# Patient Record
Sex: Female | Born: 1990 | Race: Black or African American | Hispanic: No | Marital: Single | Smoking: Never smoker
Health system: Southern US, Community
[De-identification: ages and names within clinical notes are randomized; demographics above are authoritative.]

## PROBLEM LIST (undated history)

## (undated) DIAGNOSIS — N390 Urinary tract infection, site not specified: Secondary | ICD-10-CM

## (undated) DIAGNOSIS — Z789 Other specified health status: Secondary | ICD-10-CM

## (undated) HISTORY — PX: NO PAST SURGERIES: SHX2092

## (undated) HISTORY — DX: Urinary tract infection, site not specified: N39.0

---

## 2013-10-26 ENCOUNTER — Emergency Department (HOSPITAL_COMMUNITY)
Admission: EM | Admit: 2013-10-26 | Discharge: 2013-10-26 | Disposition: A | Discharge status: Home / Self Care | Payer: Self-pay

## 2019-05-17 ENCOUNTER — Emergency Department (HOSPITAL_COMMUNITY)
Admission: EM | Admit: 2019-05-17 | Discharge: 2019-05-17 | Disposition: A | Discharge status: Home / Self Care | Payer: Self-pay | Attending: Emergency Medicine | Admitting: Emergency Medicine

## 2019-05-17 ENCOUNTER — Other Ambulatory Visit: Payer: Self-pay

## 2019-05-17 DIAGNOSIS — J309 Allergic rhinitis, unspecified: Secondary | ICD-10-CM | POA: Insufficient documentation

## 2019-05-17 DIAGNOSIS — Z79899 Other long term (current) drug therapy: Secondary | ICD-10-CM | POA: Insufficient documentation

## 2019-05-17 MED ORDER — KETOTIFEN FUMARATE 0.025 % OP SOLN: 1.0000 [drp] | Freq: Two times a day (BID) | OPHTHALMIC | 0 refills | Status: DC

## 2019-05-17 MED ORDER — LEVOCETIRIZINE DIHYDROCHLORIDE 5 MG PO TABS: 5.0000 mg | ORAL_TABLET | Freq: Every evening | ORAL | 0 refills | Status: DC

## 2019-05-17 MED ORDER — FLUTICASONE PROPIONATE 50 MCG/ACT NA SUSP: 1.0000 | Freq: Every day | NASAL | 2 refills | Status: AC

## 2019-05-17 NOTE — ED Triage Notes (Signed)
Pt reports she has had congestion for about 2-3 months. Reports she has tried several cold medications over the past few months with no relief. Pt denies any fevers. Does report a sore throat and earache on the right side.

## 2019-05-17 NOTE — ED Provider Notes (Signed)
[facility] EMERGENCY DEPARTMENT Provider Note   CSN: [id number] Arrival date & time: 05/17/19  1950    History   Chief Complaint Chief Complaint  Patient presents with  . Nasal Congestion    HPI Beverly Dickson is a 28 y.o. female. Who presents emergency department with chief complaint of itchy/runny nose.  Patient has had 3 months of itching in her throat and ears, daily clear nasal discharge, itchy and watery eyes.  She has tried Human resources officer, Mucinex allergy, Benadryl, Tylenol sinus and congestion without relief of her symptoms.  She has no new animals in her house.  She does not have a history of specific seasonal allergies.  She has not followed up with a primary care physician.  She denies wheezing, shortness of breath or hives.    HPI  No past medical history on file.  There are no active problems to display for this patient.      OB History   No obstetric history on file.      Home Medications    Prior to Admission medications   Medication Sig Start Date End Date Taking? Authorizing Provider  diphenhydrAMINE (BENADRYL) 25 mg capsule Take 25 mg by mouth every 6 (six) hours as needed for allergies.   Yes [provider]  Fexofenadine HCl (MUCINEX ALLERGY PO) Take 15 mLs by mouth daily as needed (For congestion).   Yes [provider]  fexofenadine-pseudoephedrine (ALLEGRA-D 24) 180-240 MG 24 hr tablet Take 1 tablet by mouth daily.   Yes [provider]  Phenylephrine-Acetaminophen (TYLENOL SINUS CONGESTION/PAIN PO) Take 1 capsule by mouth daily as needed (For congestion).   Yes [provider]  fluticasone (FLONASE) 50 MCG/ACT nasal spray Place 1 spray into both nostrils daily. 05/17/19   Margarita Mail, PA-C  ketotifen (ZADITOR) 0.025 % ophthalmic solution Place 1 drop into both eyes 2 (two) times daily. 05/17/19   Isaah Furry, Vernie Shanks, PA-C  levocetirizine (XYZAL) 5 MG tablet Take 1 tablet (5 mg total) by mouth every  evening. 05/17/19   Margarita Mail, PA-C    Family History No family history on file.  Social History Social History   Tobacco Use  . Smoking status: Not on file  Substance Use Topics  . Alcohol use: Not on file  . Drug use: Not on file     Allergies   Other   Review of Systems Review of Systems  Ten systems reviewed and are negative for acute change, except as noted in the HPI.   Physical Exam Updated Vital Signs BP 129/75 (BP Location: Right Arm)   Pulse 84   Temp 98.4 F (36.9 C) (Oral)   Resp 20   Ht 5\' 2"  (1.575 m)   Wt 60.8 kg   SpO2 100%   BMI 24.51 kg/m   Physical Exam Vitals signs and nursing note reviewed.  Constitutional:      General: She is not in acute distress.    Appearance: She is well-developed. She is not diaphoretic.  HENT:     Head: Normocephalic and atraumatic.     Nose:     Right Turbinates: Swollen.     Left Turbinates: Swollen.     Comments: Bilateral swollen, erythematous nasal turbinates with clear discharge. Eyes:     General: No scleral icterus.    Conjunctiva/sclera: Conjunctivae normal.  Neck:     Musculoskeletal: Normal range of motion.  Cardiovascular:     Rate and Rhythm: Normal rate and regular rhythm.  Heart sounds: Normal heart sounds. No murmur. No friction rub. No gallop.   Pulmonary:     Effort: Pulmonary effort is normal. No respiratory distress.     Breath sounds: Normal breath sounds.  Abdominal:     General: Bowel sounds are normal. There is no distension.     Palpations: Abdomen is soft. There is no mass.     Tenderness: There is no abdominal tenderness. There is no guarding.  Skin:    General: Skin is warm and dry.  Neurological:     Mental Status: She is alert and oriented to person, place, and time.  Psychiatric:        Behavior: Behavior normal.      ED Treatments / Results  Labs (all labs ordered are listed, but only abnormal results are displayed) Labs Reviewed - No data to display   EKG None  Radiology No results found.  Procedures Procedures (including critical care time)  Medications Ordered in ED Medications - No data to display   Initial Impression / Assessment and Plan / ED Course  I have reviewed the triage vital signs and the nursing notes.  Pertinent labs & imaging results that were available during my care of the patient were reviewed by me and considered in my medical decision making (see chart for details).       Patient with apparent allergic rhinitis.  No evidence of active infection.  Patient will be started on fluticasone nasal spray, levocetirizine, and ketotifen eyedrops.  She is advised to follow-up with a primary care physician and an allergist.  Discussed return precautions.  She appears appropriate for discharge at this time   Final Clinical Impressions(s) / ED Diagnoses   Final diagnoses:  Allergic rhinitis, unspecified seasonality, unspecified trigger    ED Discharge Orders         Ordered    fluticasone (FLONASE) 50 MCG/ACT nasal spray  Daily     05/17/19 0950    ketotifen (ZADITOR) 0.025 % ophthalmic solution  2 times daily     05/17/19 0951    levocetirizine (XYZAL) 5 MG tablet  Every evening     05/17/19 0951           Arthor CaptainHarris, Brenner Visconti, PA-C 05/17/19 0957    Pricilla LovelessGoldston, Scott, MD 05/17/19 1250

## 2019-05-17 NOTE — Discharge Instructions (Signed)
Contact a health care provider if:  You have a fever.  You develop a persistent cough.  You make whistling sounds when you breathe (you wheeze).  Your symptoms interfere with your normal daily activities.  Get help right away if:  You have shortness of breath

## 2019-07-28 ENCOUNTER — Other Ambulatory Visit: Payer: Self-pay

## 2019-07-28 ENCOUNTER — Inpatient Hospital Stay (HOSPITAL_COMMUNITY)
Admission: EM | Admit: 2019-07-28 | Discharge: 2019-07-28 | Disposition: A | Discharge status: Home / Self Care | Payer: Medicaid Other | Attending: Obstetrics & Gynecology | Admitting: Obstetrics & Gynecology

## 2019-07-28 ENCOUNTER — Inpatient Hospital Stay (HOSPITAL_COMMUNITY): Payer: Medicaid Other

## 2019-07-28 ENCOUNTER — Encounter (HOSPITAL_COMMUNITY): Payer: Self-pay | Admitting: Emergency Medicine

## 2019-07-28 DIAGNOSIS — M549 Dorsalgia, unspecified: Secondary | ICD-10-CM | POA: Diagnosis not present

## 2019-07-28 DIAGNOSIS — R109 Unspecified abdominal pain: Secondary | ICD-10-CM

## 2019-07-28 DIAGNOSIS — Z79899 Other long term (current) drug therapy: Secondary | ICD-10-CM | POA: Diagnosis not present

## 2019-07-28 DIAGNOSIS — R102 Pelvic and perineal pain: Secondary | ICD-10-CM | POA: Diagnosis present

## 2019-07-28 DIAGNOSIS — O418X90 Other specified disorders of amniotic fluid and membranes, unspecified trimester, not applicable or unspecified: Secondary | ICD-10-CM

## 2019-07-28 DIAGNOSIS — O3481 Maternal care for other abnormalities of pelvic organs, first trimester: Secondary | ICD-10-CM | POA: Diagnosis not present

## 2019-07-28 DIAGNOSIS — N8311 Corpus luteum cyst of right ovary: Secondary | ICD-10-CM | POA: Insufficient documentation

## 2019-07-28 DIAGNOSIS — Z3A01 Less than 8 weeks gestation of pregnancy: Secondary | ICD-10-CM | POA: Diagnosis not present

## 2019-07-28 DIAGNOSIS — R110 Nausea: Secondary | ICD-10-CM | POA: Diagnosis not present

## 2019-07-28 DIAGNOSIS — O99891 Other specified diseases and conditions complicating pregnancy: Secondary | ICD-10-CM | POA: Insufficient documentation

## 2019-07-28 DIAGNOSIS — O26899 Other specified pregnancy related conditions, unspecified trimester: Secondary | ICD-10-CM

## 2019-07-28 LAB — LIPASE, BLOOD: Lipase: 22 U/L (ref 11–51)

## 2019-07-28 LAB — CBC
HCT: 40.1 % (ref 36.0–46.0)
Hemoglobin: 13.5 g/dL (ref 12.0–15.0)
MCH: 29.8 pg (ref 26.0–34.0)
MCHC: 33.7 g/dL (ref 30.0–36.0)
MCV: 88.5 fL (ref 80.0–100.0)
Platelets: 356 10*3/uL (ref 150–400)
RBC: 4.53 MIL/uL (ref 3.87–5.11)
RDW: 13.1 % (ref 11.5–15.5)
WBC: 5.5 10*3/uL (ref 4.0–10.5)
nRBC: 0 % (ref 0.0–0.2)

## 2019-07-28 LAB — WET PREP, GENITAL
Sperm: NONE SEEN
Trich, Wet Prep: NONE SEEN
Yeast Wet Prep HPF POC: NONE SEEN

## 2019-07-28 LAB — COMPREHENSIVE METABOLIC PANEL
ALT: 13 U/L (ref 0–44)
AST: 15 U/L (ref 15–41)
Albumin: 4.3 g/dL (ref 3.5–5.0)
Alkaline Phosphatase: 53 U/L (ref 38–126)
Anion gap: 10 (ref 5–15)
BUN: 9 mg/dL (ref 6–20)
CO2: 21 mmol/L — ABNORMAL LOW (ref 22–32)
Calcium: 9.7 mg/dL (ref 8.9–10.3)
Chloride: 103 mmol/L (ref 98–111)
Creatinine, Ser: 0.69 mg/dL (ref 0.44–1.00)
GFR calc Af Amer: >60 mL/min (ref >60)
GFR calc non Af Amer: >60 mL/min (ref >60)
Glucose, Bld: 83 mg/dL (ref 70–99)
Potassium: 3.8 mmol/L (ref 3.5–5.1)
Sodium: 134 mmol/L — ABNORMAL LOW (ref 135–145)
Total Bilirubin: 0.5 mg/dL (ref 0.3–1.2)
Total Protein: 7.5 g/dL (ref 6.5–8.1)

## 2019-07-28 LAB — I-STAT BETA HCG BLOOD, ED (MC, WL, AP ONLY): I-stat hCG, quantitative: >2000 m[IU]/mL — ABNORMAL HIGH (ref <5)

## 2019-07-28 LAB — URINALYSIS, ROUTINE W REFLEX MICROSCOPIC
Bilirubin Urine: NEGATIVE
Glucose, UA: NEGATIVE mg/dL
Hgb urine dipstick: NEGATIVE
Ketones, ur: 20 mg/dL — AB
Leukocytes,Ua: NEGATIVE
Nitrite: NEGATIVE
Protein, ur: NEGATIVE mg/dL
Specific Gravity, Urine: 1.026 (ref 1.005–1.030)
pH: 6 (ref 5.0–8.0)

## 2019-07-28 LAB — HCG, QUANTITATIVE, PREGNANCY: hCG, Beta Chain, Quant, S: 45579 m[IU]/mL — ABNORMAL HIGH (ref <5)

## 2019-07-28 LAB — ABO/RH: ABO/RH(D): O POS

## 2019-07-28 MED ORDER — SODIUM CHLORIDE 0.9% FLUSH: 3.0000 mL | Freq: Once | INTRAVENOUS | Status: DC

## 2019-07-28 NOTE — ED Provider Notes (Signed)
[facility] EMERGENCY DEPARTMENT Provider Note   CSN: [id number] Arrival date & time: 07/28/19  1008     History   Chief Complaint Chief Complaint  Patient presents with  . Nausea  . Flank Pain    HPI Beverly Dickson is a 28 y.o. female.     Patient is a 29 year old G57, P5 female with no other significant past medical history presenting to the emergency department for nausea and back pain for the last 3 days.  Patient reports nausea without vomiting and feeling of pain in her lower back and lower belly.  No exacerbating or relieving factors.  Reports a white malodorous discharge.  She reports regularly she does not have normal menstrual cycles and she thinks the last one she had was sometime in September.  Denies any vaginal bleeding, diarrhea, fever, chills, dysuria, hematuria.     History reviewed. No pertinent past medical history.  There are no active problems to display for this patient.   History reviewed. No pertinent surgical history.   OB History   No obstetric history on file.      Home Medications    Prior to Admission medications   Medication Sig Start Date End Date Taking? Authorizing Provider  diphenhydrAMINE (BENADRYL) 25 mg capsule Take 25 mg by mouth every 6 (six) hours as needed for allergies.    [provider]  Fexofenadine HCl (MUCINEX ALLERGY PO) Take 15 mLs by mouth daily as needed (For congestion).    [provider]  fexofenadine-pseudoephedrine (ALLEGRA-D 24) 180-240 MG 24 hr tablet Take 1 tablet by mouth daily.    [provider]  fluticasone (FLONASE) 50 MCG/ACT nasal spray Place 1 spray into both nostrils daily. 05/17/19   Arthor Captain, PA-C  ketotifen (ZADITOR) 0.025 % ophthalmic solution Place 1 drop into both eyes 2 (two) times daily. 05/17/19   Harris, Cammy Copa, PA-C  levocetirizine (XYZAL) 5 MG tablet Take 1 tablet (5 mg total) by mouth every evening. 05/17/19   Harris, Cammy Copa, PA-C   Phenylephrine-Acetaminophen (TYLENOL SINUS CONGESTION/PAIN PO) Take 1 capsule by mouth daily as needed (For congestion).    [provider]    Family History No family history on file.  Social History Social History   Tobacco Use  . Smoking status: Not on file  Substance Use Topics  . Alcohol use: Not on file  . Drug use: Not on file     Allergies   Other   Review of Systems Review of Systems  Constitutional: Negative for appetite change, chills and fever.  HENT: Negative for congestion.   Respiratory: Negative for cough and shortness of breath.   Cardiovascular: Negative for chest pain.  Gastrointestinal: Positive for abdominal pain and nausea. Negative for abdominal distention, anal bleeding, blood in stool, constipation, diarrhea, rectal pain and vomiting.  Genitourinary: Positive for menstrual problem and vaginal discharge. Negative for decreased urine volume, difficulty urinating, dysuria, flank pain, hematuria, pelvic pain, urgency, vaginal bleeding and vaginal pain.  Musculoskeletal: Positive for back pain. Negative for arthralgias, gait problem, joint swelling, myalgias, neck pain and neck stiffness.  Skin: Negative for rash and wound.  Neurological: Negative for dizziness, light-headedness and headaches.     Physical Exam Updated Vital Signs BP 111/63 (BP Location: Left Arm)   Pulse 70   Temp 98.3 F (36.8 C) (Oral)   Resp 14   SpO2 100%   Physical Exam Vitals signs and nursing note reviewed. Exam conducted with a chaperone present.  Constitutional:  General: She is not in acute distress.    Appearance: Normal appearance. She is not ill-appearing, toxic-appearing or diaphoretic.  HENT:     Head: Normocephalic.     Right Ear: Tympanic membrane normal.     Left Ear: Tympanic membrane normal.     Nose: Nose normal.     Mouth/Throat:     Mouth: Mucous membranes are moist.     Pharynx: Oropharynx is clear.  Eyes:     Conjunctiva/sclera:  Conjunctivae normal.  Cardiovascular:     Rate and Rhythm: Normal rate and regular rhythm.     Pulses: Normal pulses.  Pulmonary:     Effort: Pulmonary effort is normal.  Abdominal:     General: Abdomen is flat. Bowel sounds are normal.     Tenderness: There is abdominal tenderness. There is no right CVA tenderness, left CVA tenderness, guarding or rebound.  Skin:    General: Skin is dry.     Capillary Refill: Capillary refill takes less than 2 seconds.  Neurological:     Mental Status: She is alert and oriented to person, place, and time.  Psychiatric:        Mood and Affect: Mood normal.      ED Treatments / Results  Labs (all labs ordered are listed, but only abnormal results are displayed) Labs Reviewed  COMPREHENSIVE METABOLIC PANEL - Abnormal; Notable for the following components:      Result Value   Sodium 134 (*)    CO2 21 (*)    All other components within normal limits  URINALYSIS, ROUTINE W REFLEX MICROSCOPIC - Abnormal; Notable for the following components:   APPearance HAZY (*)    Ketones, ur 20 (*)    All other components within normal limits  I-STAT BETA HCG BLOOD, ED ([facility], [facility], [facility] ONLY) - Abnormal; Notable for the following components:   I-stat hCG, quantitative >2,000.0 (*)    All other components within normal limits  URINE CULTURE  WET PREP, GENITAL  LIPASE, BLOOD  CBC  HCG, QUANTITATIVE, PREGNANCY  ABO/RH  GC/CHLAMYDIA PROBE AMP ([place]) NOT AT [facility]    EKG None  Radiology No results found.  Procedures Procedures (including critical care time)  Medications Ordered in ED Medications  sodium chloride flush (NS) 0.9 % injection 3 mL (3 mLs Intravenous Not Given 07/28/19 1148)     Initial Impression / Assessment and Plan / ED Course  I have reviewed the triage vital signs and the nursing notes.  Pertinent labs & imaging results that were available during my care of the patient were reviewed by me and considered in my medical decision  making (see chart for details).  Clinical Course as of Jul 28 1319  Fri Jul 28, 2019  1319 G4 P3 patient presenting to the emergency department for lower pelvic cramping.  Found to be pregnant on i-STAT hCG.  Consulted with MOU APP on-call who will take the patient for transfer for ectopic work-up.   [KM]    Clinical Course User Index [KM] Alveria Apley, PA-C       Based on review of vitals, medical screening exam, lab work and/or imaging, there does not appear to be an acute, emergent etiology for the patient's symptoms. Counseled pt on good return precautions and encouraged both PCP and ED follow-up as needed.  Prior to discharge, I also discussed incidental imaging findings with patient in detail and advised appropriate, recommended follow-up in detail.  Clinical Impression: 1. Pelvic pain  Disposition: Discharge  Prior to providing a prescription for a controlled substance, I independently reviewed the patient's recent prescription history on the [place] Controlled Substance Reporting System. The patient had no recent or regular prescriptions and was deemed appropriate for a brief, less than 3 day prescription of narcotic for acute analgesia.  This note was prepared with assistance of Conservation officer, historic buildingsDragon voice recognition software. Occasional wrong-word or sound-a-like substitutions may have occurred due to the inherent limitations of voice recognition software.   Final Clinical Impressions(s) / ED Diagnoses   Final diagnoses:  Pelvic pain    ED Discharge Orders    None       Jeral PinchMcLean, Kayline Sheer A, PA-C 07/28/19 1320    Vanetta MuldersZackowski, Scott, MD 08/07/19 1945

## 2019-07-28 NOTE — ED Notes (Signed)
PA Aundra Dubin spoke with [facility] regarding pt and advised this RN that patient may be transported over there at this time.

## 2019-07-28 NOTE — ED Notes (Signed)
Transport contacted to take patient over to [facility] 

## 2019-07-28 NOTE — ED Notes (Signed)
Transporter at bedside to take patient over to [facility]

## 2019-07-28 NOTE — MAU Note (Signed)
PT was sent over from ED for nausea and lower back pain x 4 days.  Has had urinary frequency, but no pain. Is having clear-white discharge. LMP 06/09/2019. Is pregnant, but doesn't have regular periods. No bleeding, no cramping.

## 2019-07-28 NOTE — [facility] Provider Note (Addendum)
History     CSN: [id number]  Arrival date and time: 07/28/19 1008   First Provider Initiated Contact with Patient 07/28/19 1729      Chief Complaint  Patient presents with  . Nausea  . Back Pain   HPI   Beverly Dickson is a [id number] presented to the ED nor 4 days of nausea w/out vomiting, and lower back for two days that has been intermittently increasing since last night and this morning. Patient also has noted 4 days of increased urinary frequency, denies dysuria, hematuria, and odor. Patient has noted 2 days of increasing clear-white vaginal discharge, and had to line undergarment today due to volume. She denies any pelvic itching or skin changes of perineum. Patient denies any fevers or recent UTI's. Patients LMP was noted to be 06/09/2019, no notable changes during LMP noted.   OB History    Gravida  4   Para  3   Term  3   Preterm  0   AB  0   Living  3     SAB  0   TAB  0   Ectopic  0   Multiple  0   Live Births  3             Recent Results (from the past 2160 hour(s))  Urinalysis, Routine w reflex microscopic     Status: Abnormal   Collection Time: 07/28/19 11:15 AM  Result Value Ref Range   Color, Urine YELLOW YELLOW   APPearance HAZY (A) CLEAR   Specific Gravity, Urine 1.026 1.005 - 1.030   pH 6.0 5.0 - 8.0   Glucose, UA NEGATIVE NEGATIVE mg/dL   Hgb urine dipstick NEGATIVE NEGATIVE   Bilirubin Urine NEGATIVE NEGATIVE   Ketones, ur 20 (A) NEGATIVE mg/dL   Protein, ur NEGATIVE NEGATIVE mg/dL   Nitrite NEGATIVE NEGATIVE   Leukocytes,Ua NEGATIVE NEGATIVE    Comment: Performed at [facility] [address] [place], [place] [address]  Lipase, blood     Status: None   Collection Time: 07/28/19 11:18 AM  Result Value Ref Range   Lipase 22 11 - 51 U/L    Comment: Performed at [facility] [address] [place], [place] [address]  Comprehensive metabolic panel     Status: Abnormal   Collection Time: 07/28/19 11:18 AM  Result Value  Ref Range   Sodium 134 (L) 135 - 145 mmol/L   Potassium 3.8 3.5 - 5.1 mmol/L   Chloride 103 98 - 111 mmol/L   CO2 21 (L) 22 - 32 mmol/L   Glucose, Bld 83 70 - 99 mg/dL   BUN 9 6 - 20 mg/dL   Creatinine, Ser 0.69 0.44 - 1.00 mg/dL   Calcium 9.7 8.9 - 10.3 mg/dL   Total Protein 7.5 6.5 - 8.1 g/dL   Albumin 4.3 3.5 - 5.0 g/dL   AST 15 15 - 41 U/L   ALT 13 0 - 44 U/L   Alkaline Phosphatase 53 38 - 126 U/L   Total Bilirubin 0.5 0.3 - 1.2 mg/dL   GFR calc non Af Amer >60 >60 mL/min   GFR calc Af Amer >60 >60 mL/min   Anion gap 10 5 - 15    Comment: Performed at [facility], [place] [address] [place], [place] [address]  CBC     Status: None   Collection Time: 07/28/19 11:18 AM  Result Value Ref Range   WBC 5.5 4.0 - 10.5 K/uL  RBC 4.53 3.87 - 5.11 MIL/uL   Hemoglobin 13.5 12.0 - 15.0 g/dL   HCT 16.140.1 09.636.0 - 04.546.0 %   MCV 88.5 80.0 - 100.0 fL   MCH 29.8 26.0 - 34.0 pg   MCHC 33.7 30.0 - 36.0 g/dL   RDW 40.913.1 81.111.5 - 91.415.5 %   Platelets 356 150 - 400 K/uL   nRBC 0.0 0.0 - 0.2 %    Comment: Performed at [facility], [address] [place], [place] [address]  I-Stat beta hCG blood, ED     Status: Abnormal   Collection Time: 07/28/19 11:47 AM  Result Value Ref Range   I-stat hCG, quantitative >2,000.0 (H) <5 mIU/mL   Comment 3            Comment:   GEST. AGE      CONC.  (mIU/mL)   <=1 WEEK        5 - 50     2 WEEKS       50 - 500     3 WEEKS       100 - 10,000     4 WEEKS     1,000 - 30,000        FEMALE AND NON-PREGNANT FEMALE:     LESS THAN 5 mIU/mL   ABO/Rh     Status: None   Collection Time: 07/28/19  2:03 PM  Result Value Ref Range   ABO/RH(D)      O POS Performed at [facility], [address] [place], [place] [address]   hCG, quantitative, pregnancy     Status: Abnormal   Collection Time: 07/28/19  2:03 PM  Result Value Ref Range   hCG, Beta Chain, Quant, S 45,579 (H) <5 mIU/mL    Comment:          GEST. AGE      CONC.  (mIU/mL)   <=1 WEEK         5 - 50     2 WEEKS       50 - 500     3 WEEKS       100 - 10,000     4 WEEKS     1,000 - 30,000     5 WEEKS     3,500 - 115,000   6-8 WEEKS     12,000 - 270,000    12 WEEKS     15,000 - 220,000        FEMALE AND NON-PREGNANT FEMALE:     LESS THAN 5 mIU/mL Performed at [facility], [address] [place], [place] [address]   Wet prep, genital     Status: Abnormal   Collection Time: 07/28/19  5:32 PM   Specimen: Vaginal  Result Value Ref Range   Yeast Wet Prep HPF POC NONE SEEN NONE SEEN   Trich, Wet Prep NONE SEEN NONE SEEN   Clue Cells Wet Prep HPF POC PRESENT (A) NONE SEEN   WBC, Wet Prep HPF POC MANY (A) NONE SEEN   Sperm NONE SEEN     Comment: Performed at [facility], [address] [place], [place] [address]     Social History   Tobacco Use  . Smoking status: Never Smoker  . Smokeless tobacco: Never Used  Substance Use Topics  . Alcohol use: Never    Frequency: Never  . Drug use: Yes    Types: Marijuana    Comment: last used 2weeks  ago    Allergies:  Allergies  Allergen Reactions  . Other     Nickel: Rash     Medications Prior to Admission  Medication Sig Dispense Refill Last Dose  . fluticasone (FLONASE) 50 MCG/ACT nasal spray Place 1 spray into both nostrils daily. 16 g 2 Past Week at Unknown time  . levocetirizine (XYZAL) 5 MG tablet Take 1 tablet (5 mg total) by mouth every evening. 30 tablet 0 Past Week at Unknown time  . diphenhydrAMINE (BENADRYL) 25 mg capsule Take 25 mg by mouth every 6 (six) hours as needed for allergies.     Marland Kitchen Fexofenadine HCl (MUCINEX ALLERGY PO) Take 15 mLs by mouth daily as needed (For congestion).   More than a month at Unknown time  . fexofenadine-pseudoephedrine (ALLEGRA-D 24) 180-240 MG 24 hr tablet Take 1 tablet by mouth daily.   More than a month at Unknown time  . ketotifen (ZADITOR) 0.025 % ophthalmic solution Place 1 drop into both eyes 2 (two) times daily. 5 mL 0 More than a month at Unknown time  .  Phenylephrine-Acetaminophen (TYLENOL SINUS CONGESTION/PAIN PO) Take 1 capsule by mouth daily as needed (For congestion).   More than a month at Unknown time   US Ob Comp Less 14 Wks  Result Date: 07/28/2019 CLINICAL DATA:  Abdominal pain in 1st trimester pregnancy. Unknown LMP. EXAM: OBSTETRIC <14 WK [place] AND TRANSVAGINAL OB US TECHNIQUE: Both transabdominal and transvaginal ultrasound examinations were performed for complete evaluation of the gestation as well as the maternal uterus, adnexal regions, and pelvic cul-de-sac. Transvaginal technique was performed to assess early pregnancy. COMPARISON:  None. FINDINGS: Intrauterine gestational sac: Single Yolk sac:  Visualized. Embryo:  Visualized. Cardiac Activity: Visualized. Heart Rate: 115 bpm CRL:  4 mm   6 w   0 d                  [place] EDC: 03/22/2020 Subchorionic hemorrhage: Small to moderate subchorionic hemorrhage noted. Maternal uterus/adnexae: Small right ovarian corpus luteum cyst noted. Normal appearance of left ovary. No mass or abnormal free fluid identified. IMPRESSION: Single living IUP measuring 6 weeks 0 days, with [place] EDC of 03/22/2020. Small to moderate subchorionic hemorrhage. Electronically Signed   By: Danae Orleans M.D.   On: 07/28/2019 16:57   US Ob Transvaginal  Result Date: 07/28/2019 CLINICAL DATA:  Abdominal pain in 1st trimester pregnancy. Unknown LMP. EXAM: OBSTETRIC <14 WK [place] AND TRANSVAGINAL OB US TECHNIQUE: Both transabdominal and transvaginal ultrasound examinations were performed for complete evaluation of the gestation as well as the maternal uterus, adnexal regions, and pelvic cul-de-sac. Transvaginal technique was performed to assess early pregnancy. COMPARISON:  None. FINDINGS: Intrauterine gestational sac: Single Yolk sac:  Visualized. Embryo:  Visualized. Cardiac Activity: Visualized. Heart Rate: 115 bpm CRL:  4 mm   6 w   0 d                  [place] EDC: 03/22/2020 Subchorionic hemorrhage: Small to moderate subchorionic  hemorrhage noted. Maternal uterus/adnexae: Small right ovarian corpus luteum cyst noted. Normal appearance of left ovary. No mass or abnormal free fluid identified. IMPRESSION: Single living IUP measuring 6 weeks 0 days, with [place] EDC of 03/22/2020. Small to moderate subchorionic hemorrhage. Electronically Signed   By: Danae Orleans M.D.   On: 07/28/2019 16:57    Review of Systems  Negative with exception to mention in history  Physical Exam   Blood pressure 121/65, pulse 74, temperature 98 F (  36.7 C), temperature source Oral, resp. rate 14, height 5\' 2"  (1.575 m), weight 60.3 kg, last menstrual period 06/19/2019, SpO2 100 %.  Physical Exam   HEENT: Atraumatic, norm ROM, EOMI Cadiovasc- RRR, no rubs, murmurs or gallops, capillary refill <2secs, pulse normal and equal bilaterally.  Pulm: CTAB, no CVA tenderness noted, normal WOB Abdplvc: Non distended, non tender, no suprapubic tenderness Wet prep and GC collected by RN   [facility]U Course  Procedures   Lab Orders     Urine culture     Wet prep, genital     Lipase, blood     Comprehensive metabolic panel     CBC     Urinalysis, Routine w reflex microscopic     hCG, quantitative, pregnancy     I-Stat beta hCG blood, ED   Assessment and Plan   IUB/subchorionic hemorrhage Small to moderate subchorionic hemorrhage visualized on TVUS. Also noted single living IUP w Intrauterine gestational sac Visualized yolk sac, FHR 115bpm and  measured at 6w0; consistent with LMP.  -Patient spoke with provider regarding prenatal care care going forward.  - Increase oral fluids  - Prenatal vitamins daily - RH positive blood type     06/21/2019 07/28/2019, 5:34 PM    I confirm that I have verified the information documented in the medical student's note and that I have also personally reperformed the history, physical exam and all medical decision making activities of this service and have verified that all service and findings are  accurately documented in this student's note.     07/30/2019 I, NP 07/28/2019 6:09 PM

## 2019-07-28 NOTE — ED Triage Notes (Signed)
Pt reports she has had 3 days of bilateral flank pain, with nausea. Denies emesis and diarrhea. No vaginal symptoms. LMP unknown.

## 2019-07-29 LAB — URINE CULTURE: Culture: <10000 — AB

## 2019-08-23 ENCOUNTER — Telehealth: Payer: Self-pay | Admitting: Advanced Practice Midwife

## 2019-08-23 NOTE — Telephone Encounter (Signed)
Spoke to patient about her appointment on 11/19 @ 8:30. Patient instructed that this visit will be a phone visit and she does not have to come to the office for this appointment. Patient instructed a nurse will be call her around her appointment time. Patient instructed to be available around her appointment. Patient verbalized understanding.

## 2019-08-24 ENCOUNTER — Other Ambulatory Visit: Payer: Self-pay

## 2019-08-24 ENCOUNTER — Ambulatory Visit (INDEPENDENT_AMBULATORY_CARE_PROVIDER_SITE_OTHER): Payer: Self-pay | Admitting: *Deleted

## 2019-08-24 DIAGNOSIS — Z3490 Encounter for supervision of normal pregnancy, unspecified, unspecified trimester: Secondary | ICD-10-CM

## 2019-08-24 NOTE — Progress Notes (Signed)
0820 I  called Anylah and left  A message I am calling for her telephone visit and will call again in a few minutes; please be available so we can complete your telephone visit. Linda,RN 1751 I called Kendell and left a message I am calling for her telephone visit and since I did not reach her that she will need to call the  Office to reschedule  Her visit. I also called her contact number and spoke with a female , she said [place] not with her; but she will give her message.i asked her to tell Kadeisha we were calling for her appointment and to please call us.  Linda,RN

## 2019-09-04 ENCOUNTER — Other Ambulatory Visit: Payer: Self-pay

## 2019-09-04 ENCOUNTER — Ambulatory Visit (INDEPENDENT_AMBULATORY_CARE_PROVIDER_SITE_OTHER): Payer: Self-pay | Admitting: *Deleted

## 2019-09-04 DIAGNOSIS — Z3490 Encounter for supervision of normal pregnancy, unspecified, unspecified trimester: Secondary | ICD-10-CM | POA: Insufficient documentation

## 2019-09-04 DIAGNOSIS — Z8759 Personal history of other complications of pregnancy, childbirth and the puerperium: Secondary | ICD-10-CM | POA: Insufficient documentation

## 2019-09-04 NOTE — Progress Notes (Signed)
Dating Criteria: Methods for Estimating the Due Date   

## 2019-09-04 NOTE — Patient Instructions (Signed)

## 2019-09-04 NOTE — Progress Notes (Signed)
I connected with  Beverly Dickson on 09/04/19 at 10:30 AM EST by telephone and verified that I am speaking with the correct person using two identifiers.   I discussed the limitations, risks, security and privacy concerns of performing an evaluation and management service by telephone and the availability of in person appointments. I also discussed with the patient that there may be a patient responsible charge related to this service. The patient expressed understanding and agreed to proceed. Explained I am completing her New OB Intake today. We discussed Her EDD and that it is based on  Early [place] and had unsure LMP . I reviewed her allergies, meds, OB History, Medical /Surgical history, and appropriate screenings. I explained I will send her the [employer] app- app sent to her while on phone.  I explained we will send a blood pressure cuff to [facility] that will fill that prescription and they  will call her to verify her information once she had active medicaid.  Explained  then we will have her take her blood pressure weekly and enter into the app. Explained she will have some visits in office and some virtually. I sent her MyChart text and she signed up for MyChart and downloaded the  MyChart app. I reviewed hew new ob appointment date/ time with her , our location and to wear mask, no visitors. Explained she will have exam, ob bloodwork, hemoglobin a1C, cbg , genetic testing if desired, pap if needed.She states she had a pap last year and I asked her  To sign a release for her pap smear results.  I scheduled an [place] at 19 weeks and gave her the appointment. She voices understanding.   Eldine Rencher,RN 09/04/2019  10:28 AM

## 2019-09-06 ENCOUNTER — Other Ambulatory Visit: Payer: Self-pay

## 2019-09-06 ENCOUNTER — Ambulatory Visit (INDEPENDENT_AMBULATORY_CARE_PROVIDER_SITE_OTHER): Payer: Self-pay | Admitting: Advanced Practice Midwife

## 2019-09-06 ENCOUNTER — Encounter: Payer: Self-pay | Admitting: Advanced Practice Midwife

## 2019-09-06 VITALS — BP 117/71 | HR 92 | Wt 132.0 lb

## 2019-09-06 DIAGNOSIS — Z3491 Encounter for supervision of normal pregnancy, unspecified, first trimester: Secondary | ICD-10-CM

## 2019-09-06 DIAGNOSIS — Z3490 Encounter for supervision of normal pregnancy, unspecified, unspecified trimester: Secondary | ICD-10-CM

## 2019-09-06 DIAGNOSIS — Z3A11 11 weeks gestation of pregnancy: Secondary | ICD-10-CM

## 2019-09-06 NOTE — Progress Notes (Signed)
Subjective:   Beverly Dickson is a 28 y.o. G4P3003 at [redacted]w[redacted]d by early ultrasound being seen today for her first obstetrical visit.  Her obstetrical history is significant for none . Patient does intend to breast feed. Pregnancy history fully reviewed.  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   Patient reports no complaints.  HISTORY: OB History  Gravida Para Term Preterm AB Living  4 3 3  0 0 3  SAB TAB Ectopic Multiple Live Births  0 0 0 0 3    # Outcome Date GA Lbr Len/2nd Weight Sex Delivery Anes PTL Lv  4 Current           3 Term 07/20/15 [redacted]w[redacted]d  4 lb 9 oz (2.07 kg) F Vag-Spont   LIV     Birth Comments: had kidney infection; bladder infection; blood infection- [facility] for 2 weeks  2 Term 06/17/14 [redacted]w[redacted]d  6 lb 7 oz (2.92 kg) F Vag-Spont None  LIV     Birth Comments: no complications  1 Term 36/14/43 [redacted]w[redacted]d  6 lb 9 oz (2.977 kg) F Vag-Spont None  LIV     Birth Comments: no complications    Last pap smear was done 2019 and was normal, will send ROI for pap records   Past Medical History:  Diagnosis Date  . UTI (urinary tract infection)    History reviewed. No pertinent surgical history. Family History  Adopted: Yes  Family history unknown: Yes   Social History   Tobacco Use  . Smoking status: Never Smoker  . Smokeless tobacco: Never Used  Substance Use Topics  . Alcohol use: Never    Frequency: Never  . Drug use: Not Currently    Types: Marijuana    Comment: end of October   Allergies  Allergen Reactions  . Other     Nickel: Rash    Current Outpatient Medications on File Prior to Visit  Medication Sig Dispense Refill  . diphenhydrAMINE (BENADRYL) 25 mg capsule Take 25 mg by mouth every 6 (six) hours as needed for allergies.    . fluticasone (FLONASE) 50 MCG/ACT nasal spray Place 1 spray into both nostrils daily. 16 g 2  . levocetirizine (XYZAL) 5 MG tablet Take 5 mg by mouth every evening.     No current facility-administered medications  on file prior to visit.     Review of Systems Pertinent items noted in HPI and remainder of comprehensive ROS otherwise negative.  Exam   Vitals:   09/06/19 1346  BP: 117/71  Pulse: 92  Weight: 132 lb (59.9 kg)   Fetal Heart Rate (bpm): 158  Physical Exam  Constitutional: She is oriented to person, place, and time and well-developed, well-nourished, and in no distress. No distress.  Cardiovascular: Normal rate.  Pulmonary/Chest: Effort normal.  Abdominal: Soft. There is no abdominal tenderness. There is no rebound.  Neurological: She is alert and oriented to person, place, and time.  Skin: Skin is warm and dry.  Psychiatric: Affect normal.  Nursing note and vitals reviewed.  Assessment:   Pregnancy: [id number] Patient Active Problem List   Diagnosis Date Noted  . Supervision of low-risk pregnancy 09/04/2019  . History of prior pregnancy with SGA newborn 09/04/2019     Plan:  1. Encounter for supervision of low-risk pregnancy, antepartum - Routine care  - Obstetric Panel, Including HIV - Culture, OB Urine - Hemoglobin A1c - Genetic screening  - Has BP cuff  - Patient's sister has "  [place] Syndrome" DRPLA.  - Patient has had testing at [facility]. She is not a carrier of this.    Initial labs drawn. Continue prenatal vitamins. Genetic Screening discussed, AFP, Horizon  and NIPS: requested. Ultrasound discussed; fetal anatomic survey: ordered. Problem list reviewed and updated. The nature of [facility] with multiple MDs and other Advanced Practice Providers was explained to patient; also emphasized that residents, students are part of our team. Routine obstetric precautions reviewed. 50% of 45 min visit spent in counseling and coordination of care. Return in about 4 weeks (around 10/04/2019) for virtual visit .  Thressa Sheller DNP, CNM  09/06/19  2:12 PM

## 2019-09-06 NOTE — Progress Notes (Signed)
No insurance Gave blood pressure cuff

## 2019-09-07 LAB — OBSTETRIC PANEL, INCLUDING HIV
Antibody Screen: NEGATIVE
Basophils Absolute: 0 10*3/uL (ref 0.0–0.2)
Basos: 1 %
EOS (ABSOLUTE): 0 10*3/uL (ref 0.0–0.4)
Eos: 1 %
HIV Screen 4th Generation wRfx: NONREACTIVE
Hematocrit: 34.8 % (ref 34.0–46.6)
Hemoglobin: 11.9 g/dL (ref 11.1–15.9)
Hepatitis B Surface Ag: NEGATIVE
Immature Grans (Abs): 0 10*3/uL (ref 0.0–0.1)
Immature Granulocytes: 0 %
Lymphocytes Absolute: 1.5 10*3/uL (ref 0.7–3.1)
Lymphs: 22 %
MCH: 29.5 pg (ref 26.6–33.0)
MCHC: 34.2 g/dL (ref 31.5–35.7)
MCV: 86 fL (ref 79–97)
Monocytes Absolute: 0.7 10*3/uL (ref 0.1–0.9)
Monocytes: 10 %
Neutrophils Absolute: 4.6 10*3/uL (ref 1.4–7.0)
Neutrophils: 66 %
Platelets: 375 10*3/uL (ref 150–450)
RBC: 4.03 x10E6/uL (ref 3.77–5.28)
RDW: 12.9 % (ref 11.7–15.4)
RPR Ser Ql: NONREACTIVE
Rh Factor: POSITIVE
Rubella Antibodies, IGG: 10.5 index (ref >0.99)
WBC: 6.9 10*3/uL (ref 3.4–10.8)

## 2019-09-07 LAB — HEMOGLOBIN A1C
Est. average glucose Bld gHb Est-mCnc: 100 mg/dL
Hgb A1c MFr Bld: 5.1 % (ref 4.8–5.6)

## 2019-09-08 LAB — URINE CULTURE, OB REFLEX

## 2019-09-08 LAB — CULTURE, OB URINE

## 2019-09-13 ENCOUNTER — Telehealth: Payer: Self-pay | Admitting: General Practice

## 2019-09-13 NOTE — Telephone Encounter (Signed)
Patient called and left message on nurse voicemail line stating she is pregnant and recently started a new job that requires lifting. She states someone in HR told her she needs a letter stating she is pregnant and how far along she is including any physical restrictions. She would also like to know if her Johnsie Cancel results are back yet or not.  Called patient stating I am returning her phone call. Read general pregnancy restrictions letter to patient and she states that should work. Told patient I will create it now and she will be able to see it via mychart. Patient verbalized understanding and states her job told her to not work today until she got the letter and they are requesting a letter from [place]  To excuse the absence but the patient states she doesn't think this is possible. Told patient we couldn't provide a letter since we did not take her of work/she wasn't seen in the office. Patient verbalized understanding. Discussed results are not back yet from [employer] and usually take 1.5- 2 weeks. Patient verbalized understanding & had no questions.

## 2019-09-18 ENCOUNTER — Encounter: Payer: Self-pay | Admitting: General Practice

## 2019-09-18 ENCOUNTER — Telehealth: Payer: Self-pay

## 2019-09-18 NOTE — Telephone Encounter (Signed)
Pt called for [employer] results.  Informed pt that her results are available in [place] and that we do not give gender over the phone or via [employer].  Pt asks if she can come to the office to pick up results.  I advised pt that she could she would just need to fill out ROI.  Pt verbalized understanding.

## 2019-09-26 ENCOUNTER — Telehealth: Payer: Self-pay | Admitting: Lactation Services

## 2019-09-26 NOTE — Telephone Encounter (Signed)
Called pt to inform her of results of her Horizon results. Pt did not answer. Left message for her to call the office. Sending MyChart message.

## 2019-10-04 ENCOUNTER — Telehealth (INDEPENDENT_AMBULATORY_CARE_PROVIDER_SITE_OTHER): Payer: Medicaid Other | Admitting: Student

## 2019-10-04 ENCOUNTER — Other Ambulatory Visit: Payer: Self-pay

## 2019-10-04 DIAGNOSIS — Z8759 Personal history of other complications of pregnancy, childbirth and the puerperium: Secondary | ICD-10-CM

## 2019-10-04 DIAGNOSIS — Z3A15 15 weeks gestation of pregnancy: Secondary | ICD-10-CM

## 2019-10-04 DIAGNOSIS — Z3492 Encounter for supervision of normal pregnancy, unspecified, second trimester: Secondary | ICD-10-CM

## 2019-10-04 MED ORDER — PREPLUS 27-1 MG PO TABS: 1.0000 | ORAL_TABLET | Freq: Every day | ORAL | 13 refills | Status: AC

## 2019-10-04 NOTE — Progress Notes (Addendum)
   TELEHEALTH OBSTETRICS PRENATAL VIRTUAL VIDEO VISIT ENCOUNTER NOTE  Provider location: Center for [facility] at [place]   I connected with Beverly Dickson on 10/05/19 at  1:15 PM EST by MyChart Video Encounter at home and verified that I am speaking with the correct person using two identifiers.   I discussed the limitations, risks, security and privacy concerns of performing an evaluation and management service virtually and the availability of in person appointments. I also discussed with the patient that there may be a patient responsible charge related to this service. The patient expressed understanding and agreed to proceed. Subjective:  Beverly Dickson is a 28 y.o. G4P3003 at [redacted]w[redacted]d being seen today for ongoing prenatal care.  She is currently monitored for the following issues for this low-risk pregnancy and has Supervision of low-risk pregnancy and History of prior pregnancy with SGA newborn on their problem list.  Patient reports no complaints.  Contractions: Not present. Vag. Bleeding: None.  Movement: Present. Denies any leaking of fluid.   The following portions of the patient's history were reviewed and updated as appropriate: allergies, current medications, past family history, past medical history, past social history, past surgical history and problem list.   Objective:  There were no vitals filed for this visit.  Fetal Status:     Movement: Present     General:  Alert, oriented and cooperative. Patient is in no acute distress.  Respiratory: Normal respiratory effort, no problems with respiration noted  Mental Status: Normal mood and affect. Normal behavior. Normal judgment and thought content.  Rest of physical exam deferred due to type of encounter  Imaging: No results found.  Assessment and Plan:  Pregnancy: G4P3003 at [redacted]w[redacted]d 1. 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 Same FOB as other children. Encouraged to speak  with genetic counselors as FOB has not been tested to her knowledge - AFP, Serum, Open Spina Bifida; Future - Prenatal Vit-Fe Fumarate-FA (PREPLUS) 27-1 MG TABS; Take 1 tablet by mouth daily.  Dispense: 30 tablet; Refill: 13   Preterm labor symptoms and general obstetric precautions including but not limited to vaginal bleeding, contractions, leaking of fluid and fetal movement were reviewed in detail with the patient. I discussed the assessment and treatment plan with the patient. The patient was provided an opportunity to ask questions and all were answered. The patient agreed with the plan and demonstrated an understanding of the instructions. The patient was advised to call back or seek an in-person office evaluation/go to [facility] at [facility] for any urgent or concerning symptoms. Please refer to After Visit Summary for other counseling recommendations.   I provided 10 minutes of face-to-face time during this encounter.  Return in about 4 weeks (around 11/01/2019) for Routine OB virtual.  Future Appointments  Date Time Provider [place]  10/30/2019 10:30 AM WH-MFC [place] 1 WH-MFCUS MFC-US  11/01/2019  4:15 PM Luvenia Redden, PA-C [facility] [place], [place] for [facility], [place]

## 2019-10-04 NOTE — Progress Notes (Signed)
Pt states does not have access to BP Cuff at the moment, asked pt when she's home if she can take BP & record in BRx or My Chart. Pt verbalized understanding.

## 2019-10-04 NOTE — Progress Notes (Signed)
I connected with  Ileene Hutchinson on 10/04/19 at  1:15 PM EST by telephone and verified that I am speaking with the correct person using two identifiers.   I discussed the limitations, risks, security and privacy concerns of performing an evaluation and management service by telephone and the availability of in person appointments. I also discussed with the patient that there may be a patient responsible charge related to this service. The patient expressed understanding and agreed to proceed.  Bethanne Ginger, CMA 10/04/2019  1:20 PM

## 2019-10-04 NOTE — Patient Instructions (Signed)
CIRCUMCISION  Circumcision is considered an elective/non-medically necessary procedure. There are many reasons parents decide to have their sons circumsized. During the first year of life circumcised males have a reduced risk of urinary tract infections but after this year the rates between circumcised males and uncircumcised males are the same.  It is safe to have your son circumcised outside of the hospital and the places above perform them regularly.    Places to have your son circumcised:    [facility] [phone] $480 by 4 wks  [facility] [phone] $244 by 4 wks  [facility] [phone] $175 by 2 wks  [facility] [phone] $250 by 7 days [facility] [phone] $150 by 4 wks  These prices sometimes change but are roughly what you can expect to pay. Please call and confirm pricing.     Second Trimester of Pregnancy The second trimester is from week 14 through week 27 (months 4 through 6). The second trimester is often a time when you feel your best. Your body has adjusted to being pregnant, and you begin to feel better physically. Usually, morning sickness has lessened or quit completely, you may have more energy, and you may have an increase in appetite. The second trimester is also a time when the fetus is growing rapidly. At the end of the sixth month, the fetus is about 9 inches long and weighs about 1 pounds. You will likely begin to feel the baby move (quickening) between 16 and 20 weeks of pregnancy. Body changes during your second trimester Your body continues to go through many changes during your second trimester. The changes vary from woman to woman.  Your weight will continue to increase. You will notice your lower abdomen bulging out.  You may begin to get stretch marks on your hips, abdomen, and breasts.   You may develop headaches that can be relieved by medicines. The medicines should be approved by your health care provider.  You may urinate more often because the fetus is pressing on your bladder.  You may develop or continue to have heartburn as a result of your pregnancy.  You may develop constipation because certain hormones are causing the muscles that push waste through your intestines to slow down.  You may develop hemorrhoids or swollen, bulging veins (varicose veins).  You may have back pain. This is caused by: ? Weight gain. ? Pregnancy hormones that are relaxing the joints in your pelvis. ? A shift in weight and the muscles that support your balance.  Your breasts will continue to grow and they will continue to become tender.  Your gums may bleed and may be sensitive to brushing and flossing.  Dark spots or blotches (chloasma, mask of pregnancy) may develop on your face. This will likely fade after the baby is born.  A dark line from your belly button to the pubic area (linea nigra) may appear. This will likely fade after the baby is born.  You may have changes in your hair. These can include thickening of your hair, rapid growth, and changes in texture. Some women also have hair loss during or after pregnancy, or hair that feels dry or thin. Your hair will most likely return to normal after your baby is born. What to expect at prenatal visits During a routine prenatal visit:  You will be weighed to make sure you and the fetus are growing normally.  Your blood pressure will be taken.  Your abdomen will be measured to track your baby's growth.  The  fetal heartbeat will be listened to.  Any test results from the previous visit will be discussed. Your health care provider may ask you:  How you are feeling.  If you are feeling the baby move.  If you have had any abnormal symptoms, such as leaking fluid, bleeding, severe headaches, or abdominal cramping.  If you are  using any tobacco products, including cigarettes, chewing tobacco, and electronic cigarettes.  If you have any questions. Other tests that may be performed during your second trimester include:  Blood tests that check for: ? Low iron levels (anemia). ? High blood sugar that affects pregnant women (gestational diabetes) between 88 and 28 weeks. ? Rh antibodies. This is to check for a protein on red blood cells (Rh factor).  Urine tests to check for infections, diabetes, or protein in the urine.  An ultrasound to confirm the proper growth and development of the baby.  An amniocentesis to check for possible genetic problems.  Fetal screens for spina bifida and Down syndrome.  HIV (human immunodeficiency virus) testing. Routine prenatal testing includes screening for HIV, unless you choose not to have this test. Follow these instructions at home: Medicines  Follow your health care provider's instructions regarding medicine use. Specific medicines may be either safe or unsafe to take during pregnancy.  Take a prenatal vitamin that contains at least 600 micrograms (mcg) of folic acid.  If you develop constipation, try taking a stool softener if your health care provider approves. Eating and drinking   Eat a balanced diet that includes fresh fruits and vegetables, whole grains, good sources of protein such as meat, eggs, or tofu, and low-fat dairy. Your health care provider will help you determine the amount of weight gain that is right for you.  Avoid raw meat and uncooked cheese. These carry germs that can cause birth defects in the baby.  If you have low calcium intake from food, talk to your health care provider about whether you should take a daily calcium supplement.  Limit foods that are high in fat and processed sugars, such as fried and sweet foods.  To prevent constipation: ? Drink enough fluid to keep your urine clear or pale yellow. ? Eat foods that are high in fiber,  such as fresh fruits and vegetables, whole grains, and beans. Activity  Exercise only as directed by your health care provider. Most women can continue their usual exercise routine during pregnancy. Try to exercise for 30 minutes at least 5 days a week. Stop exercising if you experience uterine contractions.  Avoid heavy lifting, wear low heel shoes, and practice good posture.  A sexual relationship may be continued unless your health care provider directs you otherwise. Relieving pain and discomfort  Wear a good support bra to prevent discomfort from breast tenderness.  Take warm sitz baths to soothe any pain or discomfort caused by hemorrhoids. Use hemorrhoid cream if your health care provider approves.  Rest with your legs elevated if you have leg cramps or low back pain.  If you develop varicose veins, wear support hose. Elevate your feet for 15 minutes, 3-4 times a day. Limit salt in your diet. Prenatal Care  Write down your questions. Take them to your prenatal visits.  Keep all your prenatal visits as told by your health care provider. This is important. Safety  Wear your seat belt at all times when driving.  Make a list of emergency phone numbers, including numbers for family, friends, the hospital, and police and fire  departments. General instructions  Ask your health care provider for a referral to a local prenatal education class. Begin classes no later than the beginning of month 6 of your pregnancy.  Ask for help if you have counseling or nutritional needs during pregnancy. Your health care provider can offer advice or refer you to specialists for help with various needs.  Do not use hot tubs, steam rooms, or saunas.  Do not douche or use tampons or scented sanitary pads.  Do not cross your legs for long periods of time.  Avoid cat litter boxes and soil used by cats. These carry germs that can cause birth defects in the baby and possibly loss of the fetus by  miscarriage or stillbirth.  Avoid all smoking, herbs, alcohol, and unprescribed drugs. Chemicals in these products can affect the formation and growth of the baby.  Do not use any products that contain nicotine or tobacco, such as cigarettes and e-cigarettes. If you need help quitting, ask your health care provider.  Visit your dentist if you have not gone yet during your pregnancy. Use a soft toothbrush to brush your teeth and be gentle when you floss. Contact a health care provider if:  You have dizziness.  You have mild pelvic cramps, pelvic pressure, or nagging pain in the abdominal area.  You have persistent nausea, vomiting, or diarrhea.  You have a bad smelling vaginal discharge.  You have pain when you urinate. Get help right away if:  You have a fever.  You are leaking fluid from your vagina.  You have spotting or bleeding from your vagina.  You have severe abdominal cramping or pain.  You have rapid weight gain or weight loss.  You have shortness of breath with chest pain.  You notice sudden or extreme swelling of your face, hands, ankles, feet, or legs.  You have not felt your baby move in over an hour.  You have severe headaches that do not go away when you take medicine.  You have vision changes. Summary  The second trimester is from week 14 through week 27 (months 4 through 6). It is also a time when the fetus is growing rapidly.  Your body goes through many changes during pregnancy. The changes vary from woman to woman.  Avoid all smoking, herbs, alcohol, and unprescribed drugs. These chemicals affect the formation and growth your baby.  Do not use any tobacco products, such as cigarettes, chewing tobacco, and e-cigarettes. If you need help quitting, ask your health care provider.  Contact your health care provider if you have any questions. Keep all prenatal visits as told by your health care provider. This is important. This information is not  intended to replace advice given to you by your health care provider. Make sure you discuss any questions you have with your health care provider. Document Released: 09/15/2001 Document Revised: 01/13/2019 Document Reviewed: 10/27/2016 Elsevier Patient Education  2020 [employer]

## 2019-10-06 NOTE — L&D Delivery Note (Addendum)
LABOR COURSE Patient was admitted for preterm labor and Cat II tracing following cervical change in MAU and recurrent variable decelerations. Her forebag was AROM'd shortly before delivery.   Delivery Note Called to room and patient was complete and +2, Forebag AROM'd. Head delivered ROT. Loose nuchal, delivered through. Shoulder and body delivered in usual fashion. At 1942 a viable female was delivered via Vaginal, Spontaneous (Presentation:ROT; ROA).  Infant with spontaneous cry, placed on mother's abdomen, dried and stimulated. Cord clamped x 2 after two-minute delay, and cut by FOB. Cord blood drawn. Placenta delivered spontaneously with gentle cord traction. Appears intact. Fundus firm with massage and Pitocin. Labia, perineum, vagina, and cervix inspected with.    APGAR:9,9; weight 2571g .   Cord: 3VC with the following complications:N/A.   Cord pH: Not indicated  Anesthesia: Epidural  Episiotomy: None Lacerations: None Est. Blood Loss (mL): 55  Mom to postpartum.   Baby to Couplet care / Skin to Skin. Postpartum appointment message sent  Clayton Bibles, [place] 02/25/20 8:27 PM

## 2019-10-20 ENCOUNTER — Encounter: Payer: Self-pay | Admitting: Advanced Practice Midwife

## 2019-10-20 ENCOUNTER — Encounter: Payer: Self-pay | Admitting: *Deleted

## 2019-10-30 ENCOUNTER — Encounter (HOSPITAL_COMMUNITY): Payer: Self-pay

## 2019-10-30 ENCOUNTER — Other Ambulatory Visit (HOSPITAL_COMMUNITY): Payer: Self-pay | Admitting: *Deleted

## 2019-10-30 ENCOUNTER — Other Ambulatory Visit: Payer: Self-pay

## 2019-10-30 ENCOUNTER — Ambulatory Visit (HOSPITAL_COMMUNITY): Payer: Medicaid Other | Admitting: *Deleted

## 2019-10-30 ENCOUNTER — Ambulatory Visit (HOSPITAL_COMMUNITY)
Admission: RE | Admit: 2019-10-30 | Discharge: 2019-10-30 | Disposition: A | Discharge status: Home / Self Care | Payer: Medicaid Other | Source: Ambulatory Visit | Attending: Advanced Practice Midwife | Admitting: Advanced Practice Midwife

## 2019-10-30 ENCOUNTER — Other Ambulatory Visit: Payer: Self-pay | Admitting: Advanced Practice Midwife

## 2019-10-30 VITALS — BP 111/70 | HR 90 | Temp 97.7°F

## 2019-10-30 DIAGNOSIS — Z3490 Encounter for supervision of normal pregnancy, unspecified, unspecified trimester: Secondary | ICD-10-CM | POA: Insufficient documentation

## 2019-10-30 DIAGNOSIS — O0990 Supervision of high risk pregnancy, unspecified, unspecified trimester: Secondary | ICD-10-CM

## 2019-10-30 DIAGNOSIS — O359XX0 Maternal care for (suspected) fetal abnormality and damage, unspecified, not applicable or unspecified: Secondary | ICD-10-CM

## 2019-10-30 DIAGNOSIS — Z8759 Personal history of other complications of pregnancy, childbirth and the puerperium: Secondary | ICD-10-CM

## 2019-10-30 DIAGNOSIS — Z3A19 19 weeks gestation of pregnancy: Secondary | ICD-10-CM | POA: Diagnosis not present

## 2019-10-30 DIAGNOSIS — Q6602 Congenital talipes equinovarus, left foot: Secondary | ICD-10-CM

## 2019-11-01 ENCOUNTER — Telehealth (INDEPENDENT_AMBULATORY_CARE_PROVIDER_SITE_OTHER): Payer: Medicaid Other | Admitting: Medical

## 2019-11-01 ENCOUNTER — Encounter: Payer: Self-pay | Admitting: Medical

## 2019-11-01 ENCOUNTER — Other Ambulatory Visit: Payer: Self-pay

## 2019-11-01 VITALS — BP 106/74 | HR 85

## 2019-11-01 DIAGNOSIS — Z8759 Personal history of other complications of pregnancy, childbirth and the puerperium: Secondary | ICD-10-CM

## 2019-11-01 DIAGNOSIS — Z3A19 19 weeks gestation of pregnancy: Secondary | ICD-10-CM

## 2019-11-01 DIAGNOSIS — Z3492 Encounter for supervision of normal pregnancy, unspecified, second trimester: Secondary | ICD-10-CM | POA: Diagnosis not present

## 2019-11-01 NOTE — Progress Notes (Signed)
I connected with  Beverly Dickson on 11/01/19 at  4:15 PM EST by telephone and verified that I am speaking with the correct person using two identifiers.   I discussed the limitations, risks, security and privacy concerns of performing an evaluation and management service by telephone and the availability of in person appointments. I also discussed with the patient that there may be a patient responsible charge related to this service. The patient expressed understanding and agreed to proceed.  [name], RN 11/01/2019  4:00 PM

## 2019-11-01 NOTE — Progress Notes (Signed)
I connected with Beverly Dickson on 11/01/19 at  4:15 PM EST by: MyChart and verified that I am speaking with the correct person using two identifiers.  Patient is located at home and provider is located at [facility].     The purpose of this virtual visit is to provide medical care while limiting exposure to the novel coronavirus. I discussed the limitations, risks, security and privacy concerns of performing an evaluation and management service by MyChart and the availability of in person appointments. I also discussed with the patient that there may be a patient responsible charge related to this service. By engaging in this virtual visit, you consent to the provision of healthcare.  Additionally, you authorize for your insurance to be billed for the services provided during this visit.  The patient expressed understanding and agreed to proceed.  The following staff members participated in the virtual visit:  [name], RN    PRENATAL VISIT NOTE  Subjective:  Beverly Dickson is a 29 y.o. G4P3003 at [redacted]w[redacted]d  for phone visit for ongoing prenatal care.  She is currently monitored for the following issues for this low-risk pregnancy and has Supervision of low-risk pregnancy and History of prior pregnancy with SGA newborn on their problem list.  Patient reports no complaints.  Contractions: Not present. Vag. Bleeding: None.  Movement: Present. Denies leaking of fluid.   The following portions of the patient's history were reviewed and updated as appropriate: allergies, current medications, past family history, past medical history, past social history, past surgical history and problem list.   Objective:   Vitals:   11/01/19 1602  BP: 106/74  Pulse: 85   Self-Obtained  Fetal Status:     Movement: Present     Assessment and Plan:  Pregnancy: G4P3003 at [redacted]w[redacted]d 1. Encounter for supervision of low-risk pregnancy in second trimester - AFP, Serum, Open Spina Bifida; Future - Anatomy US normal but  incomplete, follow-up scheduled 2/22  2. History of prior pregnancy with SGA newborn - Defer to MFM if serial growth [place] needed   Preterm labor symptoms and general obstetric precautions including but not limited to vaginal bleeding, contractions, leaking of fluid and fetal movement were reviewed in detail with the patient.  Return in about 4 weeks (around 11/29/2019) for LOB, Virtual.  Future Appointments  Date Time Provider Department Center  11/27/2019 11:30 AM [facility] NURSE [facility]  11/27/2019 11:30 AM [facility] [place] 1 [facility]     Time spent on virtual visit: 10 minutes  Vonzella Nipple, PA-C

## 2019-11-01 NOTE — Patient Instructions (Addendum)
Fetal Movement Counts Patient Name: ________________________________________________ Patient Due Date: ____________________ What is a fetal movement count?  A fetal movement count is the number of times that you feel your baby move during a certain amount of time. This may also be called a fetal kick count. A fetal movement count is recommended for every pregnant woman. You may be asked to start counting fetal movements as early as week 28 of your pregnancy. Pay attention to when your baby is most active. You may notice your baby's sleep and wake cycles. You may also notice things that make your baby move more. You should do a fetal movement count:  When your baby is normally most active.  At the same time each day. A good time to count movements is while you are resting, after having something to eat and drink. How do I count fetal movements? 1. Find a quiet, comfortable area. Sit, or lie down on your side. 2. Write down the date, the start time and stop time, and the number of movements that you felt between those two times. Take this information with you to your health care visits. 3. Write down your start time when you feel the first movement. 4. Count kicks, flutters, swishes, rolls, and jabs. You should feel at least 10 movements. 5. You may stop counting after you have felt 10 movements, or if you have been counting for 2 hours. Write down the stop time. 6. If you do not feel 10 movements in 2 hours, contact your health care provider for further instructions. Your health care provider may want to do additional tests to assess your baby's well-being. Contact a health care provider if:  You feel fewer than 10 movements in 2 hours.  Your baby is not moving like he or she usually does. Date: ____________ Start time: ____________ Stop time: ____________ Movements: ____________ Date: ____________ Start time: ____________ Stop time: ____________ Movements: ____________ Date: ____________  Start time: ____________ Stop time: ____________ Movements: ____________ Date: ____________ Start time: ____________ Stop time: ____________ Movements: ____________ Date: ____________ Start time: ____________ Stop time: ____________ Movements: ____________ Date: ____________ Start time: ____________ Stop time: ____________ Movements: ____________ Date: ____________ Start time: ____________ Stop time: ____________ Movements: ____________ Date: ____________ Start time: ____________ Stop time: ____________ Movements: ____________ Date: ____________ Start time: ____________ Stop time: ____________ Movements: ____________ This information is not intended to replace advice given to you by your health care provider. Make sure you discuss any questions you have with your health care provider. Document Revised: 05/11/2019 Document Reviewed: 05/11/2019 Elsevier Patient Education  2020 [employer] Braxton Hicks Contractions Contractions of the uterus can occur throughout pregnancy, but they are not always a sign that you are in labor. You may have practice contractions called Braxton Hicks contractions. These false labor contractions are sometimes confused with true labor. What are Braxton Hicks contractions? Braxton Hicks contractions are tightening movements that occur in the muscles of the uterus before labor. Unlike true labor contractions, these contractions do not result in opening (dilation) and thinning of the cervix. Toward the end of pregnancy (32-34 weeks), Braxton Hicks contractions can happen more often and may become stronger. These contractions are sometimes difficult to tell apart from true labor because they can be very uncomfortable. You should not feel embarrassed if you go to the hospital with false labor. Sometimes, the only way to tell if you are in true labor is for your health care provider to look for changes in the cervix. The health care provider   will do a physical exam and may  monitor your contractions. If you are not in true labor, the exam should show that your cervix is not dilating and your water has not broken. If there are no other health problems associated with your pregnancy, it is completely safe for you to be sent home with false labor. You may continue to have Braxton Hicks contractions until you go into true labor. How to tell the difference between true labor and false labor True labor  Contractions last 30-70 seconds.  Contractions become very regular.  Discomfort is usually felt in the top of the uterus, and it spreads to the lower abdomen and low back.  Contractions do not go away with walking.  Contractions usually become more intense and increase in frequency.  The cervix dilates and gets thinner. False labor  Contractions are usually shorter and not as strong as true labor contractions.  Contractions are usually irregular.  Contractions are often felt in the front of the lower abdomen and in the groin.  Contractions may go away when you walk around or change positions while lying down.  Contractions get weaker and are shorter-lasting as time goes on.  The cervix usually does not dilate or become thin. Follow these instructions at home:   Take over-the-counter and prescription medicines only as told by your health care provider.  Keep up with your usual exercises and follow other instructions from your health care provider.  Eat and drink lightly if you think you are going into labor.  If Braxton Hicks contractions are making you uncomfortable: ? Change your position from lying down or resting to walking, or change from walking to resting. ? Sit and rest in a tub of warm water. ? Drink enough fluid to keep your urine pale yellow. Dehydration may cause these contractions. ? Do slow and deep breathing several times an hour.  Keep all follow-up prenatal visits as told by your health care provider. This is important. Contact a  health care provider if:  You have a fever.  You have continuous pain in your abdomen. Get help right away if:  Your contractions become stronger, more regular, and closer together.  You have fluid leaking or gushing from your vagina.  You pass blood-tinged mucus (bloody show).  You have bleeding from your vagina.  You have low back pain that you never had before.  You feel your baby's head pushing down and causing pelvic pressure.  Your baby is not moving inside you as much as it used to. Summary  Contractions that occur before labor are called Braxton Hicks contractions, false labor, or practice contractions.  Braxton Hicks contractions are usually shorter, weaker, farther apart, and less regular than true labor contractions. True labor contractions usually become progressively stronger and regular, and they become more frequent.  Manage discomfort from Braxton Hicks contractions by changing position, resting in a warm bath, drinking plenty of water, or practicing deep breathing. This information is not intended to replace advice given to you by your health care provider. Make sure you discuss any questions you have with your health care provider. Document Revised: 09/03/2017 Document Reviewed: 02/04/2017 Elsevier Patient Education  2020 Else[employer] Inc.  Places to have your son circumcised:                                                                        [facility]     [phone]   $480 while you are in hospital         [facility]              [phone]   $269 by 4 wks                      [facility]                     [phone]   $269 by 7 days [facility]                    [phone]   $269 by 4 wks [facility]             [phone]   $225 by 2 wks    These prices sometimes change but are roughly what you can expect to pay. Please call and confirm pricing.   Circumcision is considered an elective/non-medically necessary procedure. There are many reasons parents decide to have  their sons circumsized. During the first year of life circumcised males have a reduced risk of urinary tract infections but after this year the rates between circumcised males and uncircumcised males are the same.  It is safe to have your son circumcised outside of the hospital and the places above perform them regularly.   Deciding about Circumcision in Baby Boys  (Up-to-date The Basics)  What is circumcision?   Circumcision is a surgery that removes the skin that covers the tip of the penis, called the "foreskin" Circumcision is usually done when a boy is between 1 and 10 days old. In the [place], circumcision is common. In some other countries, fewer boys are circumcised. Circumcision is a common tradition in some religions.  Should I have my baby boy circumcised?   There is no easy answer. Circumcision has some benefits. But it also has risks. After talking with your doctor, you will have to decide for yourself what is right for your family.  What are the benefits of circumcision?   Circumcised boys seem to have slightly lower rates of: ?Urinary tract infections ?Swelling of the opening at the tip of the penis Circumcised men seem to have slightly lower rates of: ?Urinary tract infections ?Swelling of the opening at the tip of the penis ?Penis cancer ?HIV and other infections that you catch during sex ?Cervical cancer in the women they have sex with Even so, in the [place], the risks of these problems are small - even in boys and men who have not been circumcised. Plus, boys and men who are not circumcised can reduce these extra risks by: ?Cleaning their penis well ?Using condoms during sex  What are the risks of circumcision?  Risks include: ?Bleeding or infection from the surgery ?Damage to or amputation of the penis ?A chance that the doctor will cut off too much or not enough of the foreskin ?A chance that sex won't feel as good later in life Only about 1 out of  every 200 circumcisions leads to problems. There is also a chance that your health insurance won't pay for circumcision.  How is circumcision done in baby boys?  First, the baby gets medicine for pain relief. This might be a cream on the skin or a shot into the base of the penis. Next, the doctor cleans the baby's penis well. Then he or she uses special tools to cut off the foreskin. Finally, the doctor   wraps a bandage (called gauze) around the baby's penis. If you have your baby circumcised, his doctor or nurse will give you instructions on how to care for him after the surgery. It is important that you follow those instructions carefully.  

## 2019-11-03 ENCOUNTER — Encounter: Payer: Self-pay | Admitting: Advanced Practice Midwife

## 2019-11-06 ENCOUNTER — Other Ambulatory Visit: Payer: Self-pay

## 2019-11-06 DIAGNOSIS — O26892 Other specified pregnancy related conditions, second trimester: Secondary | ICD-10-CM

## 2019-11-06 MED ORDER — FAMOTIDINE 20 MG PO TABS: 20.0000 mg | ORAL_TABLET | Freq: Two times a day (BID) | ORAL | 1 refills | Status: AC

## 2019-11-06 NOTE — Progress Notes (Signed)
Patient sent a mychart message asking for something for heartburn. Per protocol sent Pepcid-20mg  BID.

## 2019-11-27 ENCOUNTER — Other Ambulatory Visit: Payer: Medicaid Other

## 2019-11-27 ENCOUNTER — Other Ambulatory Visit: Payer: Self-pay

## 2019-11-27 ENCOUNTER — Encounter (HOSPITAL_COMMUNITY): Payer: Self-pay

## 2019-11-27 ENCOUNTER — Ambulatory Visit (HOSPITAL_COMMUNITY): Payer: Medicaid Other | Admitting: *Deleted

## 2019-11-27 ENCOUNTER — Ambulatory Visit (HOSPITAL_COMMUNITY)
Admission: RE | Admit: 2019-11-27 | Discharge: 2019-11-27 | Disposition: A | Discharge status: Home / Self Care | Payer: Medicaid Other | Source: Ambulatory Visit | Attending: Obstetrics and Gynecology | Admitting: Obstetrics and Gynecology

## 2019-11-27 DIAGNOSIS — Z8759 Personal history of other complications of pregnancy, childbirth and the puerperium: Secondary | ICD-10-CM

## 2019-11-27 DIAGNOSIS — Z362 Encounter for other antenatal screening follow-up: Secondary | ICD-10-CM | POA: Diagnosis not present

## 2019-11-27 DIAGNOSIS — Q6602 Congenital talipes equinovarus, left foot: Secondary | ICD-10-CM | POA: Diagnosis present

## 2019-11-27 DIAGNOSIS — Z3A23 23 weeks gestation of pregnancy: Secondary | ICD-10-CM

## 2019-11-27 DIAGNOSIS — O09292 Supervision of pregnancy with other poor reproductive or obstetric history, second trimester: Secondary | ICD-10-CM | POA: Diagnosis not present

## 2019-11-27 DIAGNOSIS — Z3492 Encounter for supervision of normal pregnancy, unspecified, second trimester: Secondary | ICD-10-CM

## 2019-11-28 DIAGNOSIS — Z148 Genetic carrier of other disease: Secondary | ICD-10-CM | POA: Insufficient documentation

## 2019-11-28 NOTE — Progress Notes (Signed)
I connected with Beverly Dickson on 11/29/19 at  3:55 PM EST by: MyChart and verified that I am speaking with the correct person using two identifiers.  [place] and provider is located at [facility].     The purpose of this virtual visit is to provide medical care while limiting exposure to the novel coronavirus. I discussed the limitations, risks, security and privacy concerns of performing an evaluation and management service by MyChart and the availability of in person appointments. I also discussed with the patient that there may be a patient responsible charge related to this service. By engaging in this virtual visit, you consent to the provision of healthcare.  Additionally, you authorize for your insurance to be billed for the services provided during this visit.  The patient expressed understanding and agreed to proceed.  The following staff members participated in the virtual visit:  Donia Ast, NP    PRENATAL VISIT NOTE  Subjective:  Beverly Dickson is a 29 y.o. G4P3003 at [redacted]w[redacted]d  for phone visit for ongoing prenatal care.  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  Patient reports no complaints.  Contractions: Not present. Vag. Bleeding: None.  Movement: Present. Denies leaking of fluid.   The following portions of the patient's history were reviewed and updated as appropriate: allergies, current medications, past family history, past medical history, past social history, past surgical history and problem list.   Objective:  There were no vitals filed for this visit. [place] the park and unable to take BP. Will take later today/tomorrow and send results via MyChart message to staff.  Fetal Status:     Movement: Present     Assessment and Plan:  Pregnancy: G4P3003 at [redacted]w[redacted]d  1. Encounter for  supervision of low-risk pregnancy in second trimester -CMA to obtain Pap records -pt to bring birth plan to next visit to discuss  2. History of prior pregnancy with SGA newborn - Defer to MFM if serial growth [place] needed  3. 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 The patient was provided an opportunity to ask questions and all were answered. The patient agreed with the plan and demonstrated an understanding of the instructions. The patient was advised to call back or seek an in-person office evaluation/go to [facility] at [facility] for any urgent or concerning symptoms. Preterm labor symptoms and general obstetric precautions including but not limited to vaginal bleeding, contractions, leaking of fluid and fetal movement were reviewed in detail with the patient.  Return in about 4 weeks (around 12/27/2019) for in-person ROB/GTT/labs - next visit with delivering MD or CNM to discuss birth plan.  No future appointments.   Time spent on virtual visit: 7 minutes  Marylen Ponto, NP

## 2019-11-29 ENCOUNTER — Telehealth (INDEPENDENT_AMBULATORY_CARE_PROVIDER_SITE_OTHER): Payer: Medicaid Other | Admitting: Women's Health

## 2019-11-29 DIAGNOSIS — Z3492 Encounter for supervision of normal pregnancy, unspecified, second trimester: Secondary | ICD-10-CM | POA: Diagnosis not present

## 2019-11-29 DIAGNOSIS — Z3A23 23 weeks gestation of pregnancy: Secondary | ICD-10-CM | POA: Diagnosis not present

## 2019-11-29 DIAGNOSIS — Z8759 Personal history of other complications of pregnancy, childbirth and the puerperium: Secondary | ICD-10-CM

## 2019-11-29 DIAGNOSIS — Z148 Genetic carrier of other disease: Secondary | ICD-10-CM

## 2019-11-29 LAB — AFP, SERUM, OPEN SPINA BIFIDA
AFP MoM: 2.2
AFP Value: 217.6 ng/mL
Gest. Age on Collection Date: 23.3 weeks
Maternal Age At EDD: 30 yr
OSBR Risk 1 IN: 1047
Test Results:: NEGATIVE
Weight: 147 [lb_av]

## 2019-11-29 NOTE — Patient Instructions (Addendum)
Second Trimester of Pregnancy The second trimester is from week 14 through week 27 (months 4 through 6). The second trimester is often a time when you feel your best. Your body has adjusted to being pregnant, and you begin to feel better physically. Usually, morning sickness has lessened or quit completely, you may have more energy, and you may have an increase in appetite. The second trimester is also a time when the fetus is growing rapidly. At the end of the sixth month, the fetus is about 9 inches long and weighs about 1 pounds. You will likely begin to feel the baby move (quickening) between 16 and 20 weeks of pregnancy. Body changes during your second trimester Your body continues to go through many changes during your second trimester. The changes vary from woman to woman.  Your weight will continue to increase. You will notice your lower abdomen bulging out.  You may begin to get stretch marks on your hips, abdomen, and breasts.  You may develop headaches that can be relieved by medicines. The medicines should be approved by your health care provider.  You may urinate more often because the fetus is pressing on your bladder.  You may develop or continue to have heartburn as a result of your pregnancy.  You may develop constipation because certain hormones are causing the muscles that push waste through your intestines to slow down.  You may develop hemorrhoids or swollen, bulging veins (varicose veins).  You may have back pain. This is caused by: ? Weight gain. ? Pregnancy hormones that are relaxing the joints in your pelvis. ? A shift in weight and the muscles that support your balance.  Your breasts will continue to grow and they will continue to become tender.  Your gums may bleed and may be sensitive to brushing and flossing.  Dark spots or blotches (chloasma, mask of pregnancy) may develop on your face. This will likely fade after the baby is born.  A dark line from your  belly button to the pubic area (linea nigra) may appear. This will likely fade after the baby is born.  You may have changes in your hair. These can include thickening of your hair, rapid growth, and changes in texture. Some women also have hair loss during or after pregnancy, or hair that feels dry or thin. Your hair will most likely return to normal after your baby is born. What to expect at prenatal visits During a routine prenatal visit:  You will be weighed to make sure you and the fetus are growing normally.  Your blood pressure will be taken.  Your abdomen will be measured to track your baby's growth.  The fetal heartbeat will be listened to.  Any test results from the previous visit will be discussed. Your health care provider may ask you:  How you are feeling.  If you are feeling the baby move.  If you have had any abnormal symptoms, such as leaking fluid, bleeding, severe headaches, or abdominal cramping.  If you are using any tobacco products, including cigarettes, chewing tobacco, and electronic cigarettes.  If you have any questions. Other tests that may be performed during your second trimester include:  Blood tests that check for: ? Low iron levels (anemia). ? High blood sugar that affects pregnant women (gestational diabetes) between 24 and 28 weeks. ? Rh antibodies. This is to check for a protein on red blood cells (Rh factor).  Urine tests to check for infections, diabetes, or protein in the   urine.  An ultrasound to confirm the proper growth and development of the baby.  An amniocentesis to check for possible genetic problems.  Fetal screens for spina bifida and Down syndrome.  HIV (human immunodeficiency virus) testing. Routine prenatal testing includes screening for HIV, unless you choose not to have this test. Follow these instructions at home: Medicines  Follow your health care provider's instructions regarding medicine use. Specific medicines may be  either safe or unsafe to take during pregnancy.  Take a prenatal vitamin that contains at least 600 micrograms (mcg) of folic acid.  If you develop constipation, try taking a stool softener if your health care provider approves. Eating and drinking   Eat a balanced diet that includes fresh fruits and vegetables, whole grains, good sources of protein such as meat, eggs, or tofu, and low-fat dairy. Your health care provider will help you determine the amount of weight gain that is right for you.  Avoid raw meat and uncooked cheese. These carry germs that can cause birth defects in the baby.  If you have low calcium intake from food, talk to your health care provider about whether you should take a daily calcium supplement.  Limit foods that are high in fat and processed sugars, such as fried and sweet foods.  To prevent constipation: ? Drink enough fluid to keep your urine clear or pale yellow. ? Eat foods that are high in fiber, such as fresh fruits and vegetables, whole grains, and beans. Activity  Exercise only as directed by your health care provider. Most women can continue their usual exercise routine during pregnancy. Try to exercise for 30 minutes at least 5 days a week. Stop exercising if you experience uterine contractions.  Avoid heavy lifting, wear low heel shoes, and practice good posture.  A sexual relationship may be continued unless your health care provider directs you otherwise. Relieving pain and discomfort  Wear a good support bra to prevent discomfort from breast tenderness.  Take warm sitz baths to soothe any pain or discomfort caused by hemorrhoids. Use hemorrhoid cream if your health care provider approves.  Rest with your legs elevated if you have leg cramps or low back pain.  If you develop varicose veins, wear support hose. Elevate your feet for 15 minutes, 3-4 times a day. Limit salt in your diet. Prenatal Care  Write down your questions. Take them to  your prenatal visits.  Keep all your prenatal visits as told by your health care provider. This is important. Safety  Wear your seat belt at all times when driving.  Make a list of emergency phone numbers, including numbers for family, friends, the hospital, and police and fire departments. General instructions  Ask your health care provider for a referral to a local prenatal education class. Begin classes no later than the beginning of month 6 of your pregnancy.  Ask for help if you have counseling or nutritional needs during pregnancy. Your health care provider can offer advice or refer you to specialists for help with various needs.  Do not use hot tubs, steam rooms, or saunas.  Do not douche or use tampons or scented sanitary pads.  Do not cross your legs for long periods of time.  Avoid cat litter boxes and soil used by cats. These carry germs that can cause birth defects in the baby and possibly loss of the fetus by miscarriage or stillbirth.  Avoid all smoking, herbs, alcohol, and unprescribed drugs. Chemicals in these products can affect the formation   and growth of the baby.  Do not use any products that contain nicotine or tobacco, such as cigarettes and e-cigarettes. If you need help quitting, ask your health care provider.  Visit your dentist if you have not gone yet during your pregnancy. Use a soft toothbrush to brush your teeth and be gentle when you floss. Contact a health care provider if:  You have dizziness.  You have mild pelvic cramps, pelvic pressure, or nagging pain in the abdominal area.  You have persistent nausea, vomiting, or diarrhea.  You have a bad smelling vaginal discharge.  You have pain when you urinate. Get help right away if:  You have a fever.  You are leaking fluid from your vagina.  You have spotting or bleeding from your vagina.  You have severe abdominal cramping or pain.  You have rapid weight gain or weight loss.  You have  shortness of breath with chest pain.  You notice sudden or extreme swelling of your face, hands, ankles, feet, or legs.  You have not felt your baby move in over an hour.  You have severe headaches that do not go away when you take medicine.  You have vision changes. Summary  The second trimester is from week 14 through week 27 (months 4 through 6). It is also a time when the fetus is growing rapidly.  Your body goes through many changes during pregnancy. The changes vary from woman to woman.  Avoid all smoking, herbs, alcohol, and unprescribed drugs. These chemicals affect the formation and growth your baby.  Do not use any tobacco products, such as cigarettes, chewing tobacco, and e-cigarettes. If you need help quitting, ask your health care provider.  Contact your health care provider if you have any questions. Keep all prenatal visits as told by your health care provider. This is important. This information is not intended to replace advice given to you by your health care provider. Make sure you discuss any questions you have with your health care provider. Document Revised: 01/13/2019 Document Reviewed: 10/27/2016 Elsevier Patient Education  2020 Else[employer] Inc[employer]   Preterm Labor and Birth Information  The normal length of a pregnancy is 39-41 weeks. Preterm labor is when labor starts before 37 completed weeks of pregnancy. What are the risk factors for preterm labor? Preterm labor is more likely to occur in women who:  Have certain infections during pregnancy such as a bladder infection, sexually transmitted infection, or infection inside the uterus (chorioamnionitis).  Have a shorter-than-normal cervix.  Have gone into preterm labor before.  Have had surgery on their cervix.  Are younger than age 17 or older than age 35.  Are African American.  Are pregnant with twins or multiple babies (multiple gestation).  Take street drugs or smoke while pregnant.  Do not gain  enough weight while pregnant.  Became pregnant shortly after having been pregnant. What are the symptoms of preterm labor? Symptoms of preterm labor include:  Cramps similar to those that can happen during a menstrual period. The cramps may happen with diarrhea.  Pain in the abdomen or lower back.  Regular uterine contractions that may feel like tightening of the abdomen.  A feeling of increased pressure in the pelvis.  Increased watery or bloody mucus discharge from the vagina.  Water breaking (ruptured amniotic sac). Why is it important to recognize signs of preterm labor? It is important to recognize signs of preterm labor because babies who are born prematurely may not be fully developed. This   can put them at an increased risk for:  Long-term (chronic) heart and lung problems.  Difficulty immediately after birth with regulating body systems, including blood sugar, body temperature, heart rate, and breathing rate.  Bleeding in the brain.  Cerebral palsy.  Learning difficulties.  Death. These risks are highest for babies who are born before 34 weeks of pregnancy. How is preterm labor treated? Treatment depends on the length of your pregnancy, your condition, and the health of your baby. It may involve:  Having a stitch (suture) placed in your cervix to prevent your cervix from opening too early (cerclage).  Taking or being given medicines, such as: ? Hormone medicines. These may be given early in pregnancy to help support the pregnancy. ? Medicine to stop contractions. ? Medicines to help mature the baby's lungs. These may be prescribed if the risk of delivery is high. ? Medicines to prevent your baby from developing cerebral palsy. If the labor happens before 34 weeks of pregnancy, you may need to stay in the hospital. What should I do if I think I am in preterm labor? If you think that you are going into preterm labor, call your health care provider right away. How  can I prevent preterm labor in future pregnancies? To increase your chance of having a full-term pregnancy:  Do not use any tobacco products, such as cigarettes, chewing tobacco, and e-cigarettes. If you need help quitting, ask your health care provider.  Do not use street drugs or medicines that have not been prescribed to you during your pregnancy.  Talk with your health care provider before taking any herbal supplements, even if you have been taking them regularly.  Make sure you gain a healthy amount of weight during your pregnancy.  Watch for infection. If you think that you might have an infection, get it checked right away.  Make sure to tell your health care provider if you have gone into preterm labor before. This information is not intended to replace advice given to you by your health care provider. Make sure you discuss any questions you have with your health care provider. Document Revised: 01/13/2019 Document Reviewed: 02/12/2016 Elsevier Patient Education  2020 Elsevier Inc.   Maternity Assessment Unit (MAU)  The Maternity Assessment Unit (MAU) is located at the [facility]. The address is: [address], [address], [place], [place] [address]. Please see map below for additional directions.    The Maternity Assessment Unit is designed to help you during your pregnancy, and for up to 6 weeks after delivery, with any pregnancy- or postpartum-related emergencies, if you think you are in labor, or if your water has broken. For example, if you experience nausea and vomiting, vaginal bleeding, severe abdominal or pelvic pain, elevated blood pressure or other problems related to your pregnancy or postpartum time, please come to the Maternity Assessment Unit for assistance. 

## 2019-11-29 NOTE — Progress Notes (Signed)
I connected with  Beverly Dickson on 11/29/19 at  3:55 PM EST by telephone and verified that I am speaking with the correct person using two identifiers.   I discussed the limitations, risks, security and privacy concerns of performing an evaluation and management service by telephone and the availability of in person appointments. I also discussed with the patient that there may be a patient responsible charge related to this service. The patient expressed understanding and agreed to proceed.  [name], CMA 11/29/2019  2:50 PM

## 2019-12-11 ENCOUNTER — Other Ambulatory Visit (HOSPITAL_COMMUNITY): Payer: Self-pay | Admitting: *Deleted

## 2019-12-11 DIAGNOSIS — Z362 Encounter for other antenatal screening follow-up: Secondary | ICD-10-CM

## 2019-12-13 ENCOUNTER — Telehealth: Payer: Self-pay | Admitting: *Deleted

## 2019-12-13 MED ORDER — CLOBETASOL PROPIONATE 0.05 % EX OINT: 1.0000 "application " | TOPICAL_OINTMENT | Freq: Two times a day (BID) | CUTANEOUS | 1 refills | Status: AC

## 2019-12-13 MED ORDER — PANTOPRAZOLE SODIUM 40 MG PO TBEC: 40.0000 mg | DELAYED_RELEASE_TABLET | Freq: Every day | ORAL | 4 refills | Status: DC

## 2019-12-13 NOTE — Telephone Encounter (Signed)
Pt left VM message stating that Medicaid does not cover the medication she was prescribed for heartburn due to the dose of twice daily. She requests a different dose or different medication. Per consult w/Dr. Macon Large, Rx sent in for Protonix. I called pt and informed her of medication change. Pt stated that she is also having a flair up of eczema rash on her inner thighs and requests Rx for Clobetasol ointment. Rx approved by Dr. Macon Large. Pt voiced understanding.

## 2019-12-20 ENCOUNTER — Other Ambulatory Visit: Payer: Self-pay

## 2019-12-20 DIAGNOSIS — Z3492 Encounter for supervision of normal pregnancy, unspecified, second trimester: Secondary | ICD-10-CM

## 2019-12-25 ENCOUNTER — Encounter (HOSPITAL_COMMUNITY): Payer: Self-pay

## 2019-12-25 ENCOUNTER — Other Ambulatory Visit (HOSPITAL_COMMUNITY): Payer: Self-pay | Admitting: *Deleted

## 2019-12-25 ENCOUNTER — Ambulatory Visit (HOSPITAL_COMMUNITY)
Admission: RE | Admit: 2019-12-25 | Discharge: 2019-12-25 | Disposition: A | Discharge status: Home / Self Care | Payer: Medicaid Other | Source: Ambulatory Visit | Attending: Obstetrics | Admitting: Obstetrics

## 2019-12-25 ENCOUNTER — Other Ambulatory Visit: Payer: Self-pay

## 2019-12-25 ENCOUNTER — Ambulatory Visit (HOSPITAL_COMMUNITY): Payer: Medicaid Other | Admitting: *Deleted

## 2019-12-25 DIAGNOSIS — Z8759 Personal history of other complications of pregnancy, childbirth and the puerperium: Secondary | ICD-10-CM | POA: Insufficient documentation

## 2019-12-25 DIAGNOSIS — Z3A27 27 weeks gestation of pregnancy: Secondary | ICD-10-CM | POA: Diagnosis not present

## 2019-12-25 DIAGNOSIS — Z148 Genetic carrier of other disease: Secondary | ICD-10-CM | POA: Diagnosis not present

## 2019-12-25 DIAGNOSIS — O09299 Supervision of pregnancy with other poor reproductive or obstetric history, unspecified trimester: Secondary | ICD-10-CM | POA: Diagnosis not present

## 2019-12-25 DIAGNOSIS — Z362 Encounter for other antenatal screening follow-up: Secondary | ICD-10-CM | POA: Insufficient documentation

## 2019-12-26 ENCOUNTER — Encounter: Payer: Medicaid Other | Admitting: Advanced Practice Midwife

## 2019-12-26 ENCOUNTER — Other Ambulatory Visit: Payer: Medicaid Other

## 2019-12-27 ENCOUNTER — Other Ambulatory Visit: Payer: Medicaid Other

## 2019-12-27 ENCOUNTER — Other Ambulatory Visit: Payer: Self-pay

## 2019-12-27 ENCOUNTER — Other Ambulatory Visit (HOSPITAL_COMMUNITY)
Admission: RE | Admit: 2019-12-27 | Discharge: 2019-12-27 | Disposition: A | Discharge status: Home / Self Care | Payer: Medicaid Other | Source: Ambulatory Visit

## 2019-12-27 ENCOUNTER — Ambulatory Visit (INDEPENDENT_AMBULATORY_CARE_PROVIDER_SITE_OTHER): Payer: Medicaid Other

## 2019-12-27 VITALS — BP 121/68 | HR 79 | Wt 151.0 lb

## 2019-12-27 DIAGNOSIS — N898 Other specified noninflammatory disorders of vagina: Secondary | ICD-10-CM | POA: Insufficient documentation

## 2019-12-27 DIAGNOSIS — Z3A27 27 weeks gestation of pregnancy: Secondary | ICD-10-CM

## 2019-12-27 DIAGNOSIS — Z23 Encounter for immunization: Secondary | ICD-10-CM

## 2019-12-27 DIAGNOSIS — O99891 Other specified diseases and conditions complicating pregnancy: Secondary | ICD-10-CM | POA: Diagnosis not present

## 2019-12-27 DIAGNOSIS — Z3493 Encounter for supervision of normal pregnancy, unspecified, third trimester: Secondary | ICD-10-CM | POA: Diagnosis not present

## 2019-12-27 DIAGNOSIS — Z3492 Encounter for supervision of normal pregnancy, unspecified, second trimester: Secondary | ICD-10-CM

## 2019-12-27 NOTE — Progress Notes (Signed)
   PRENATAL VISIT NOTE  Subjective:  Beverly Dickson is a 29 y.o. [phone] at [redacted]w[redacted]d who presents today for routine prenatal care.  She is currently being monitored for supervision of a low-risk pregnancy with problems as listed below.  Patient has no pregnancy related concerns and endorses fetal movement.  She reports some vaginal discharge that she states is white, thick like yogurt, and odorless, but started causing some itching about 2 days ago.  Patient denies bleeding, leaking, and burning.   Patient has several requests regarding labor and birth. Expresses concern with current statistics regarding AA woman and maternity care.   Patient Active Problem List   Diagnosis Date Noted  . 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 Supervision of low-risk pregnancy 09/04/2019  . History of prior pregnancy with SGA newborn 09/04/2019    The following portions of the patient's history were reviewed and updated as appropriate: allergies, current medications, past family history, past medical history, past social history, past surgical history and problem list. Problem list updated.  Objective:   Vitals:   12/27/19 0907  BP: 121/68  Pulse: 79  Weight: 151 lb (68.5 kg)    Fetal Status: Fetal Heart Rate (bpm): 147 Fundal Height: 30 cm Movement: Present     General:  Alert, oriented and cooperative. Patient is in no acute distress.  Skin: Skin is warm and dry.   Cardiovascular: Regular rate and rhythm.  Respiratory: Normal respiratory effort. CTA-Bilaterally  Abdomen: Soft, gravid, appropriate for gestational age.  Pelvic: Cervical exam deferred        Extremities: Normal range of motion.  Edema: None  Mental Status: Normal mood and affect. Normal behavior. Normal judgment and thought content.   Assessment and Plan:  Pregnancy: G4P3003 at [redacted]w[redacted]d  1. Encounter for supervision of low-risk pregnancy in third trimester -Extensive discussion regarding labor plans and requests. *Desires CNM  delivery. *Desires dimmed lighting. *Delivery in position other than supine *Delayed cord clamping -Provider acknowledges patient's concern regarding maternity care as an AA woman. -Reassured that our team strives to provide optimal care for all patients while considering individual concerns and variations.  Encouraged to be a self advocate, but also be reasonable! -Informed that birth requests are reasonable and that can easily be accommodated. -Reviewed blood draw procedures and labs which also include check of iron level.  -Discussed how results of GTT are handled including diabetic education and BS testing for abnormal results and routine care for normal results.     2. Vaginal itching -Self swab for CV -Will await results and treat accordingly   Preterm labor symptoms and general obstetric precautions including but not limited to vaginal bleeding, contractions, leaking of fluid and fetal movement were reviewed with the patient.  Please refer to After Visit Summary for other counseling recommendations.  No follow-ups on file.  Future Appointments  Date Time Provider Department Center  12/27/2019 10:55 AM Gerrit Heck, CNM [facility]  01/29/2020  9:30 AM [facility] [place] 1 [facility]    Cherre Robins, CNM 12/27/2019, 9:24 AM

## 2019-12-28 ENCOUNTER — Other Ambulatory Visit: Payer: Self-pay

## 2019-12-28 DIAGNOSIS — B373 Candidiasis of vulva and vagina: Secondary | ICD-10-CM

## 2019-12-28 DIAGNOSIS — B3731 Acute candidiasis of vulva and vagina: Secondary | ICD-10-CM

## 2019-12-28 LAB — CBC
Hematocrit: 30.7 % — ABNORMAL LOW (ref 34.0–46.6)
Hemoglobin: 9.9 g/dL — ABNORMAL LOW (ref 11.1–15.9)
MCH: 27.6 pg (ref 26.6–33.0)
MCHC: 32.2 g/dL (ref 31.5–35.7)
MCV: 86 fL (ref 79–97)
Platelets: 345 10*3/uL (ref 150–450)
RBC: 3.59 x10E6/uL — ABNORMAL LOW (ref 3.77–5.28)
RDW: 13.1 % (ref 11.7–15.4)
WBC: 10.6 10*3/uL (ref 3.4–10.8)

## 2019-12-28 LAB — GLUCOSE TOLERANCE, 2 HOURS W/ 1HR
Glucose, 1 hour: 139 mg/dL (ref 65–179)
Glucose, 2 hour: 97 mg/dL (ref 65–152)
Glucose, Fasting: 69 mg/dL (ref 65–91)

## 2019-12-28 LAB — RPR: RPR Ser Ql: NONREACTIVE

## 2019-12-28 LAB — CERVICOVAGINAL ANCILLARY ONLY
Bacterial Vaginitis (gardnerella): POSITIVE — AB
Candida Glabrata: NEGATIVE
Candida Vaginitis: POSITIVE — AB
Comment: NEGATIVE
Comment: NEGATIVE
Comment: NEGATIVE

## 2019-12-28 LAB — HIV ANTIBODY (ROUTINE TESTING W REFLEX): HIV Screen 4th Generation wRfx: NONREACTIVE

## 2019-12-28 MED ORDER — TERCONAZOLE 0.8 % VA CREA: 1.0000 | TOPICAL_CREAM | Freq: Every day | VAGINAL | 0 refills | Status: DC

## 2019-12-30 IMAGING — US US OB COMP LESS 14 WK
1 series · 15 of 28 positions shown · non-contrast
Comparison: None.

CLINICAL DATA: Abdominal pain in 1st trimester pregnancy. Unknown
LMP.

EXAM:
OBSTETRIC <14 WK US AND TRANSVAGINAL OB US
TECHNIQUE: Both transabdominal and transvaginal ultrasound examinations were
performed for complete evaluation of the gestation as well as the
maternal uterus, adnexal regions, and pelvic cul-de-sac.
Transvaginal technique was performed to assess early pregnancy.

[Series 1: us ob comp less 14 wk · 54 acquisitions, 15 frames shown]
[im 1/54]
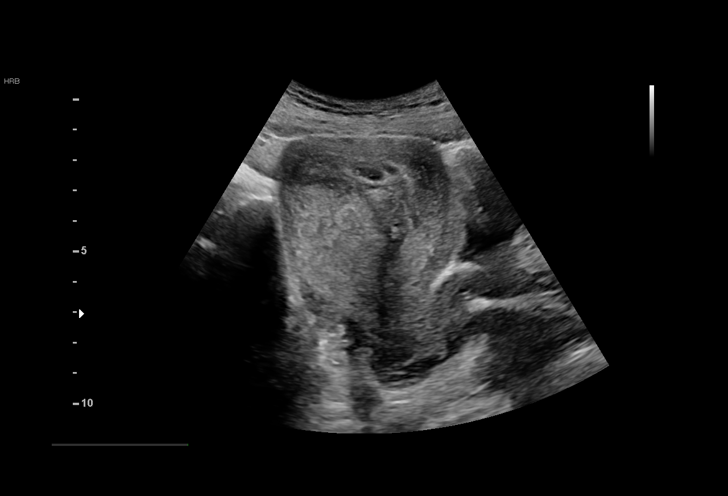
[im 4/54]
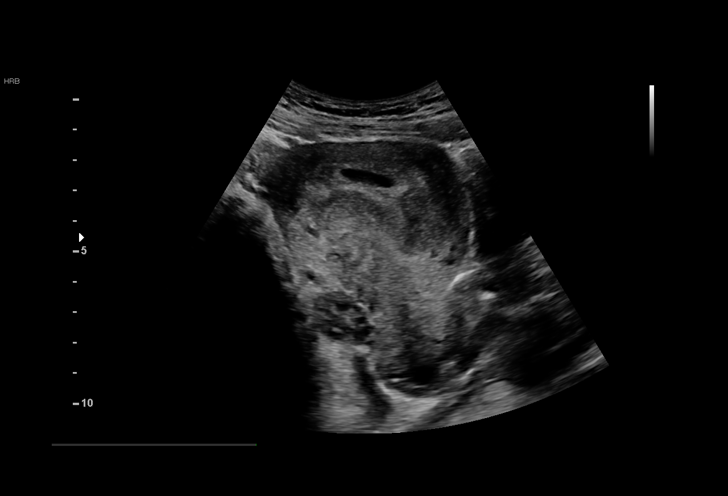
[im 8/54]
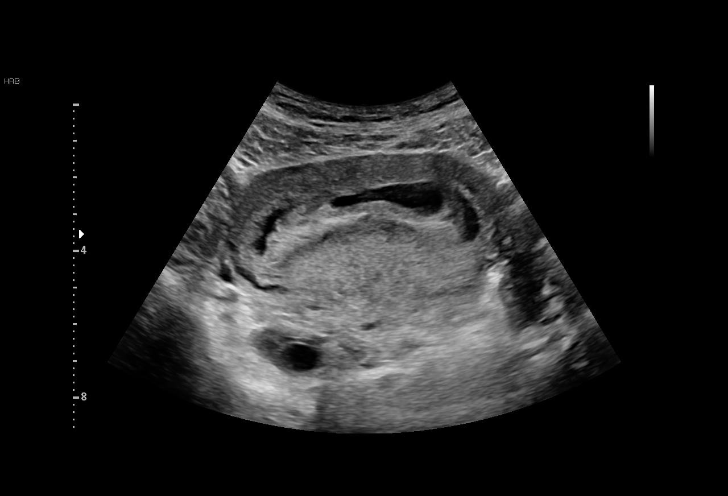
[im 12/54]
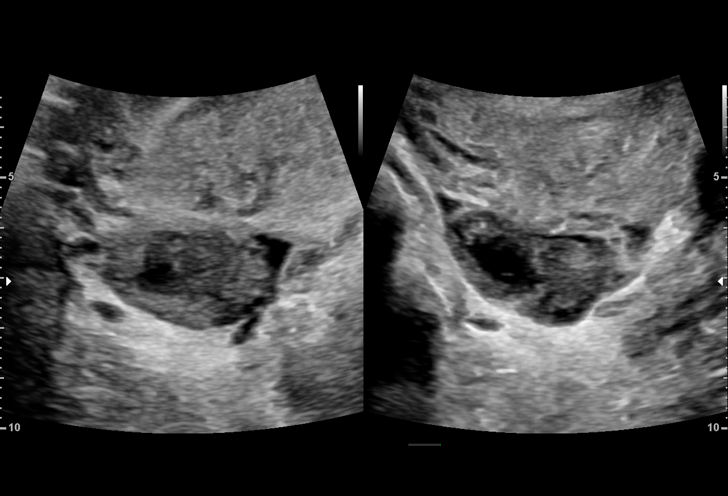
[im 16/54]
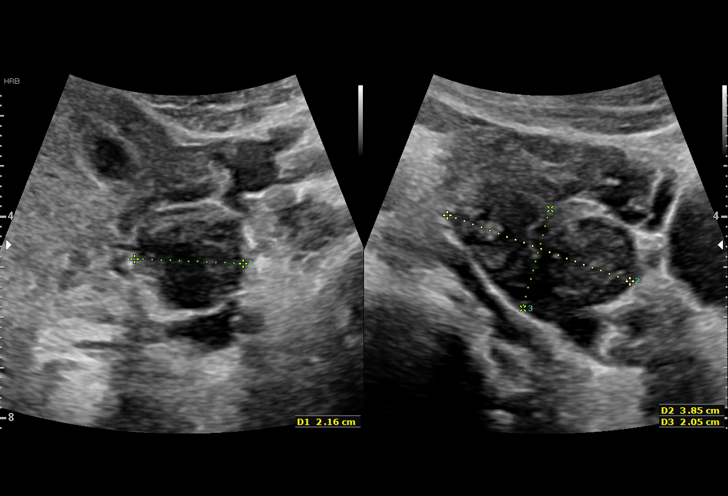
[im 20/54]
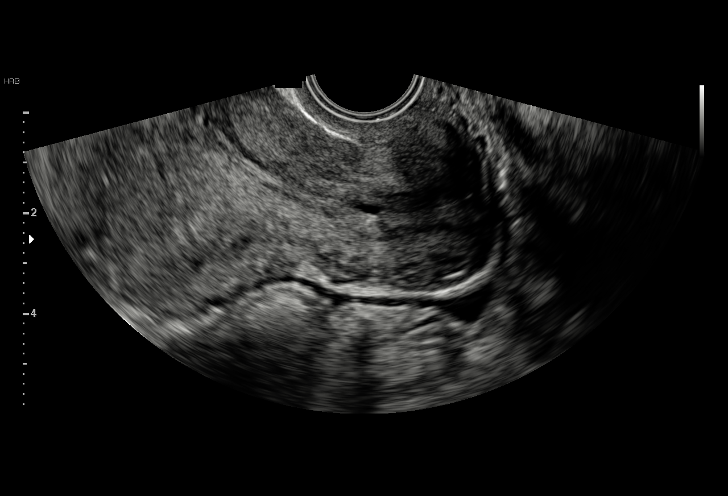
[im 24/54]
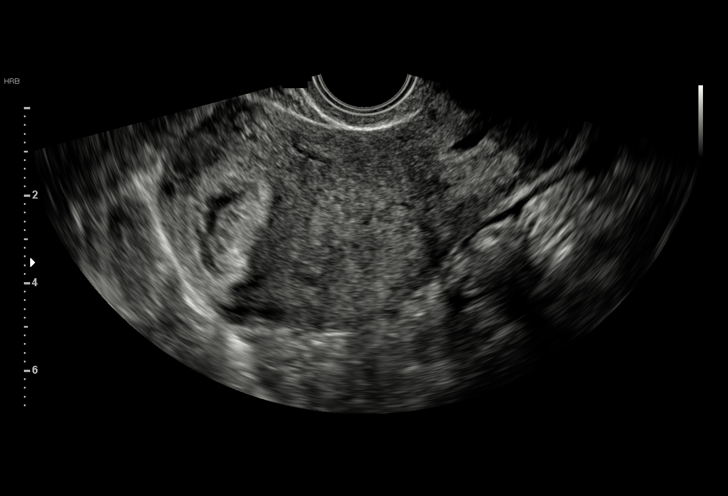
[im 28/54]
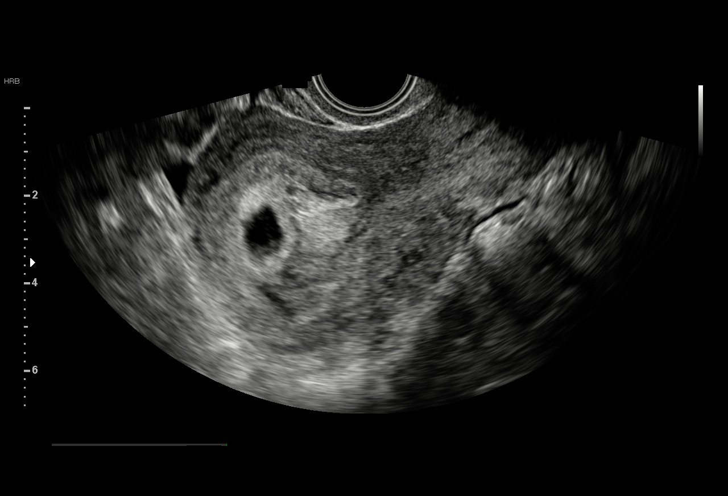
[im 30/54]
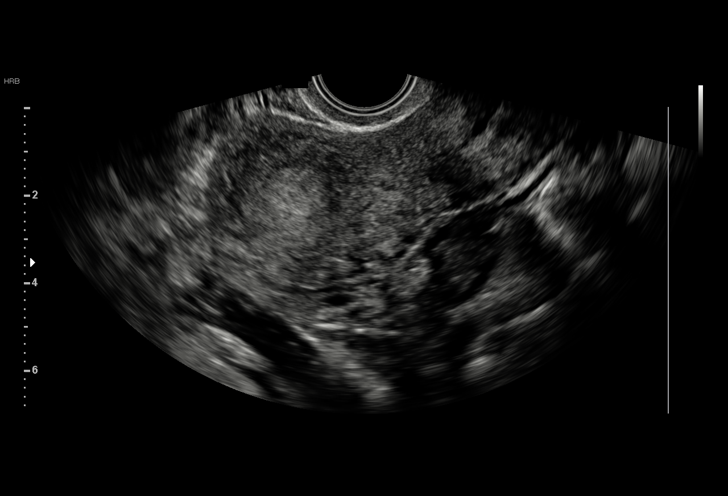
[im 34/54]
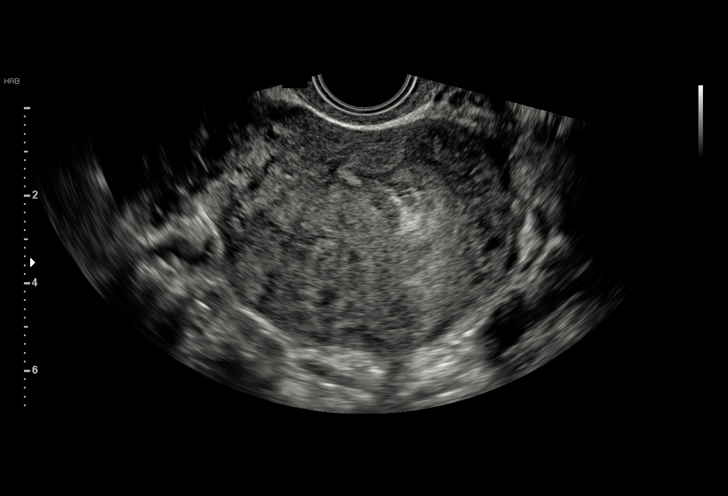
[im 38/54]
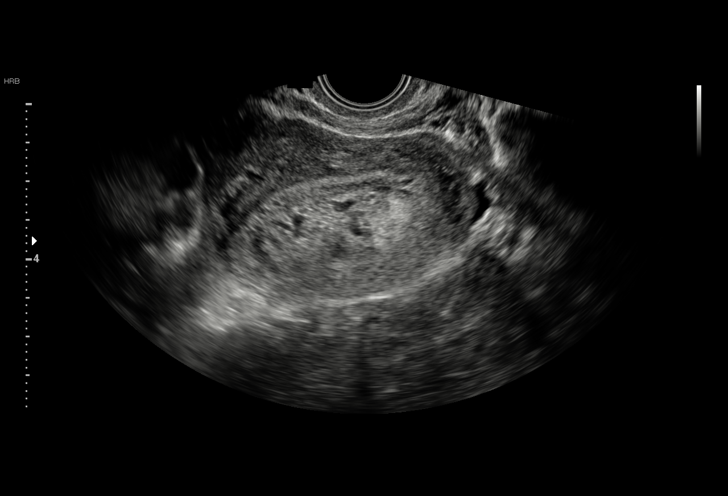
[im 42/54]
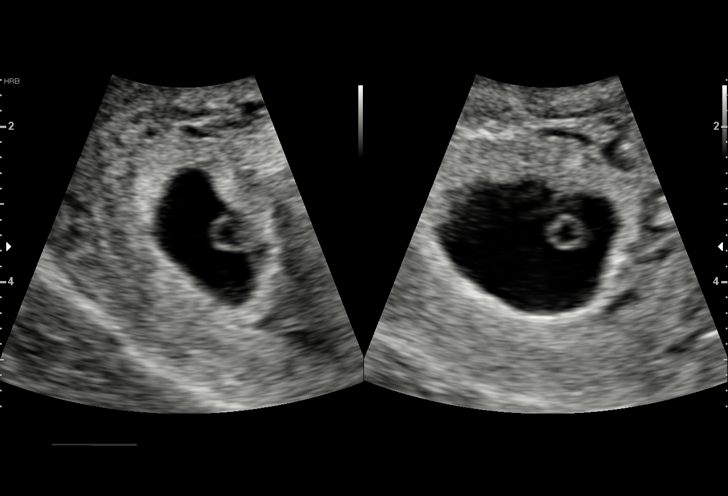
[im 46/54]
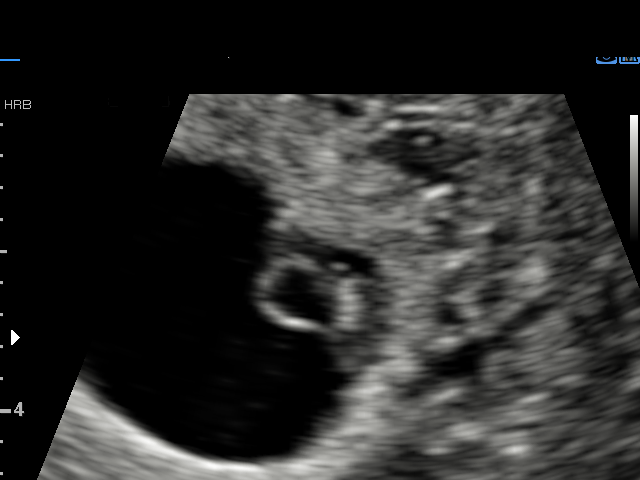
[im 50/54]
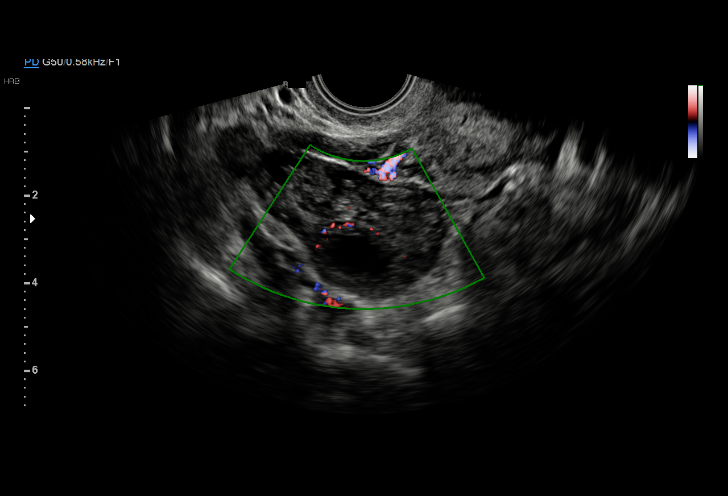
[im 54/54]
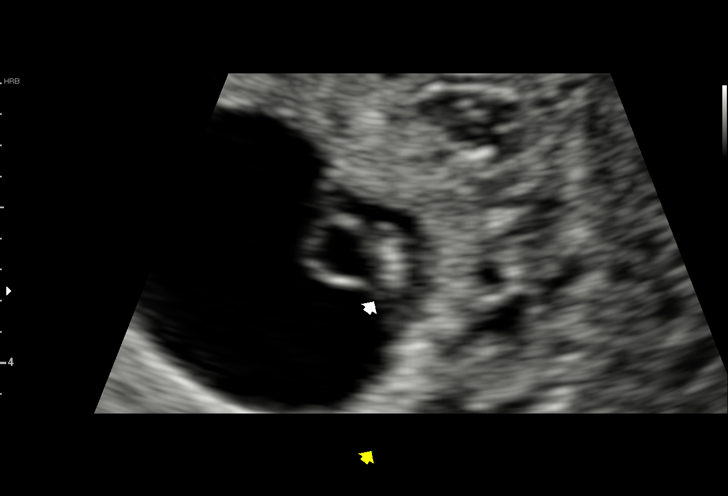

[15 of 28 positions shown; findings below may reference images not displayed]

FINDINGS: Intrauterine gestational sac: Single

Yolk sac:  Visualized.

Embryo:  Visualized.

Cardiac Activity: Visualized.

Heart Rate: 115 bpm

CRL:  4 mm   6 w   0 d                  US EDC: 03/22/2020

Subchorionic hemorrhage: Small to moderate subchorionic hemorrhage
noted.

Maternal uterus/adnexae: Small right ovarian corpus luteum cyst
noted. Normal appearance of left ovary. No mass or abnormal free
fluid identified.
IMPRESSION: Single living IUP measuring 6 weeks 0 days, with US EDC of
03/22/2020.

Small to moderate subchorionic hemorrhage.

## 2020-01-09 ENCOUNTER — Encounter: Payer: Self-pay | Admitting: Advanced Practice Midwife

## 2020-01-09 ENCOUNTER — Telehealth (INDEPENDENT_AMBULATORY_CARE_PROVIDER_SITE_OTHER): Payer: Medicaid Other | Admitting: Advanced Practice Midwife

## 2020-01-09 DIAGNOSIS — Z148 Genetic carrier of other disease: Secondary | ICD-10-CM | POA: Diagnosis not present

## 2020-01-09 DIAGNOSIS — Z3493 Encounter for supervision of normal pregnancy, unspecified, third trimester: Secondary | ICD-10-CM

## 2020-01-09 DIAGNOSIS — O09293 Supervision of pregnancy with other poor reproductive or obstetric history, third trimester: Secondary | ICD-10-CM

## 2020-01-09 DIAGNOSIS — Z3A29 29 weeks gestation of pregnancy: Secondary | ICD-10-CM

## 2020-01-09 DIAGNOSIS — Z8759 Personal history of other complications of pregnancy, childbirth and the puerperium: Secondary | ICD-10-CM

## 2020-01-09 NOTE — Progress Notes (Signed)
I connected with  [name] on 01/09/20 at  9:55 AM EDT by telephone and verified that I am speaking with the correct person using two identifiers.   I discussed the limitations, risks, security and privacy concerns of performing an evaluation and management service by telephone and the availability of in person appointments. I also discussed with the patient that there may be a patient responsible charge related to this service. The patient expressed understanding and agreed to proceed.  Henrietta Dine, CMA 01/09/2020  9:30 AM

## 2020-01-09 NOTE — Progress Notes (Signed)
   TELEHEALTH VIRTUAL OBSTETRICS VISIT ENCOUNTER NOTE  I connected with Beverly Dickson on 01/09/20 at  9:55 AM EDT by telephone at home and verified that I am speaking with the correct person using two identifiers.   I discussed the limitations, risks, security and privacy concerns of performing an evaluation and management service by telephone and the availability of in person appointments. I also discussed with the patient that there may be a patient responsible charge related to this service. The patient expressed understanding and agreed to proceed.  Subjective:  Beverly Dickson is a 29 y.o. G4P3003 at [redacted]w[redacted]d being followed for ongoing prenatal care.  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  Patient reports no complaints. Reports fetal movement. Denies any contractions, bleeding or leaking of fluid.   The following portions of the patient's history were reviewed and updated as appropriate: allergies, current medications, past family history, past medical history, past social history, past surgical history and problem list.   Objective:   General:  Alert, oriented and cooperative.   Mental Status: Normal mood and affect perceived. Normal judgment and thought content.  Rest of physical exam deferred due to type of encounter  Assessment and Plan:  Pregnancy: G4P3003 at [redacted]w[redacted]d 1. Genetic carrier  2. Encounter for supervision of low-risk pregnancy in third trimester - routine care - discussed prenatal yoga and stretching exercises to help with common discomforts of pregnancy.  - Patient informed that Medicaid is now paying for circumcision.   3. History of prior pregnancy with SGA newborn   Preterm labor symptoms and general obstetric precautions including but not limited to vaginal bleeding, contractions, leaking of fluid  and fetal movement were reviewed in detail with the patient.  I discussed the assessment and treatment plan with the patient. The patient was provided an opportunity to ask questions and all were answered. The patient agreed with the plan and demonstrated an understanding of the instructions. The patient was advised to call back or seek an in-person office evaluation/go to MAU at [facility] for any urgent or concerning symptoms. Please refer to After Visit Summary for other counseling recommendations.   I provided 12 minutes of non-face-to-face time during this encounter.  Return in about 2 weeks (around 01/23/2020) for virtual visit .  Future Appointments  Date Time Provider Department Center  01/09/2020  9:55 AM Armando Reichert, CNM [facility] WOC  01/29/2020  9:30 AM [facility] [place] 1 [facility] M[facility]-US    Thressa Sheller DNP, CNM  01/09/20  9:39 AM

## 2020-01-25 ENCOUNTER — Telehealth (INDEPENDENT_AMBULATORY_CARE_PROVIDER_SITE_OTHER): Payer: Medicaid Other | Admitting: Obstetrics and Gynecology

## 2020-01-25 DIAGNOSIS — Z148 Genetic carrier of other disease: Secondary | ICD-10-CM

## 2020-01-25 DIAGNOSIS — Z8759 Personal history of other complications of pregnancy, childbirth and the puerperium: Secondary | ICD-10-CM

## 2020-01-25 DIAGNOSIS — Z3A31 31 weeks gestation of pregnancy: Secondary | ICD-10-CM

## 2020-01-25 DIAGNOSIS — Z3493 Encounter for supervision of normal pregnancy, unspecified, third trimester: Secondary | ICD-10-CM

## 2020-01-25 NOTE — Patient Instructions (Signed)
[email or web address]

## 2020-01-25 NOTE — Progress Notes (Signed)
   TELEHEALTH VIRTUAL OBSTETRICS VISIT ENCOUNTER NOTE  I connected with Beverly Dickson on 01/25/20 at  8:35 AM EDT by telephone at home and verified that I am speaking with the correct person using two identifiers.   I discussed the limitations, risks, security and privacy concerns of performing an evaluation and management service by telephone and the availability of in person appointments. I also discussed with the patient that there may be a patient responsible charge related to this service. The patient expressed understanding and agreed to proceed.  Subjective:  Beverly Dickson is a 29 y.o. G4P3003 at [redacted]w[redacted]d being followed for ongoing prenatal care.  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  Patient reports no complaints. Reports fetal movement. Denies any contractions, bleeding or leaking of fluid.   The following portions of the patient's history were reviewed and updated as appropriate: allergies, current medications, past family history, past medical history, past social history, past surgical history and problem list.   Objective:   General:  Alert, oriented and cooperative.   Mental Status: Normal mood and affect perceived. Normal judgment and thought content.  Rest of physical exam deferred due to type of encounter  Assessment and Plan:  Pregnancy: G4P3003 at [redacted]w[redacted]d 1. 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   2. Encounter for supervision of low-risk pregnancy in third trimester  BP 128/53 Added to [facility]   3. History of prior pregnancy with SGA newborn  EFW good, 87%   Preterm labor symptoms and general obstetric precautions including but not limited to vaginal bleeding, contractions, leaking of fluid and fetal movement were reviewed in detail with the  patient.  I discussed the assessment and treatment plan with the patient. The patient was provided an opportunity to ask questions and all were answered. The patient agreed with the plan and demonstrated an understanding of the instructions. The patient was advised to call back or seek an in-person office evaluation/go to [facility]U at [facility] for any urgent or concerning symptoms. Please refer to After Visit Summary for other counseling recommendations.   I provided 11 minutes of non-face-to-face time during this encounter.  No follow-ups on file.  Future Appointments  Date Time Provider Department Center  01/29/2020  9:30 AM [facility] [place] 1 [facility]-MFCUS M[facility]-US    Venia Carbon, NP Center for [employer], [facility]

## 2020-01-25 NOTE — Progress Notes (Signed)
I connected with  Beverly Dickson on 01/25/20 at  8:35 AM EDT by telephone and verified that I am speaking with the correct person using two identifiers.   I discussed the limitations, risks, security and privacy concerns of performing an evaluation and management service by telephone and the availability of in person appointments. I also discussed with the patient that there may be a patient responsible charge related to this service. The patient expressed understanding and agreed to proceed.  [name], CMA 01/25/2020  8:35 AM

## 2020-01-29 ENCOUNTER — Other Ambulatory Visit (HOSPITAL_COMMUNITY): Payer: Self-pay | Admitting: *Deleted

## 2020-01-29 ENCOUNTER — Other Ambulatory Visit: Payer: Self-pay

## 2020-01-29 ENCOUNTER — Ambulatory Visit (HOSPITAL_COMMUNITY)
Admission: RE | Admit: 2020-01-29 | Discharge: 2020-01-29 | Disposition: A | Discharge status: Home / Self Care | Payer: Medicaid Other | Source: Ambulatory Visit | Attending: Obstetrics and Gynecology | Admitting: Obstetrics and Gynecology

## 2020-01-29 ENCOUNTER — Ambulatory Visit: Payer: Self-pay | Admitting: Genetic Counselor

## 2020-01-29 ENCOUNTER — Ambulatory Visit (HOSPITAL_BASED_OUTPATIENT_CLINIC_OR_DEPARTMENT_OTHER): Payer: Medicaid Other | Admitting: Genetic Counselor

## 2020-01-29 DIAGNOSIS — O09293 Supervision of pregnancy with other poor reproductive or obstetric history, third trimester: Secondary | ICD-10-CM | POA: Diagnosis not present

## 2020-01-29 DIAGNOSIS — Z148 Genetic carrier of other disease: Secondary | ICD-10-CM

## 2020-01-29 DIAGNOSIS — Z8759 Personal history of other complications of pregnancy, childbirth and the puerperium: Secondary | ICD-10-CM | POA: Insufficient documentation

## 2020-01-29 DIAGNOSIS — Z362 Encounter for other antenatal screening follow-up: Secondary | ICD-10-CM

## 2020-01-29 DIAGNOSIS — Z315 Encounter for genetic counseling: Secondary | ICD-10-CM

## 2020-01-29 DIAGNOSIS — O09299 Supervision of pregnancy with other poor reproductive or obstetric history, unspecified trimester: Secondary | ICD-10-CM

## 2020-01-29 DIAGNOSIS — Z3A32 32 weeks gestation of pregnancy: Secondary | ICD-10-CM

## 2020-01-29 DIAGNOSIS — Z31430 Encounter of female for testing for genetic disease carrier status for procreative management: Secondary | ICD-10-CM

## 2020-01-29 NOTE — Progress Notes (Signed)
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 Beverly Dickson came to her appointment alone due to COVID-19 visitor restrictions.   Indication for genetic counseling - Increased risk to be silent carrier for spinal muscular atrophy  Prenatal history  Beverly Dickson is a [id number], 29 y.o. female. Her current pregnancy has completed [redacted]w[redacted]d (Estimated Date of Delivery: 03/22/20).  Beverly Dickson denied exposure to environmental toxins or chemical agents. She denied the use of alcohol, tobacco or street drugs. She reported taking prenatal vitamins and acid reflux medications. She denied significant viral illnesses, fevers, and bleeding during the course of her pregnancy. Her medical and surgical histories were noncontributory.  Family History  A three generation pedigree was drafted and reviewed. The family history is remarkable for the following:  - Beverly Dickson was adopted; however, she was a good historian and had many details about her biological relatives' medical histories.   - Beverly Dickson's maternal half sister, maternal aunt, and maternal first cousin all have Independence syndrome, AKA dentatorubral-pallidoluysian atrophy (DRPLA). DRPLA is a progressive brain disorder that causes involuntary movements, mental and emotional problems, and a decline in cognitive ability. The average age of onset of DRPLA is 30 years, but the condition can appear any time from infancy to mid-adulthood. DRPLA is inherited in an autosomal dominant pattern. Beverly Dickson reportedly had negative genetic testing and genetic counseling at [facility]; however, these records were not available for my review. If Beverly Dickson does not carry the same genetic change as her relatives affected by DRPLA, the chances of her having a child with the condition are significantly decreased.  - Beverly Dickson has  a paternal half brother whose son has learning difficulties and motor delays. We discussed that many times, learning difficulties and developmental delays are multifactorial in nature, occurring due to a combination of genetic and environmental factors that are difficult to identify. Learning disabilities and developmental delays can appear to run in families; thus, there is a chance that the couple's children could also experience learning difficulties or developmental delays of some kind. Beverly Dickson understands that she should make the pediatrician aware of any concerns she has about her children's development.  - Beverly Dickson's partner, Beverly Dickson, had four maternal aunts/uncles who reportedly died of SIDS. Beverly Dickson did not have any further details about the precise cause of death for these children; thus, risk assessment was limited.  - Beverly Dickson's father died in his late 85s or early 41s of a heart attack. He also had high blood pressure. We discussed that many forms of heart disease are multifactorial in nature, occurring due to a combination of genetic, lifestyle, and environmental factors. Known risk factors for having a heart attack at a young age include substance abuse, smoking, hypertension, hypercholesterolemia, lack of physical activity, diabetes, and poor diet. However, some families appear to have a strong family history of heart disease, and certain genetic conditions can be associated with heart attacks at young ages. Given that no one else in the family has a history of heart disease or heart attacks, Mr. Beverly Dickson father's heart attack was likely multifactorial in nature.  The remaining family histories were reviewed and found to be noncontributory for birth defects, intellectual disability, recurrent pregnancy loss, and known genetic conditions.    The patient's ethnicity is African American. The father of the pregnancy's ethnicity  is Tree surgeon. Ashkenazi Jewish ancestry and  consanguinity were denied. Pedigree will be scanned under Media.  Discussion  Beverly Dickson had Horizon-14 carrier screening performed through [place]. 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 This puts her at increased risk (1 in 34, or ~3%) to be a silent 2+0 carrier for SMA. SMA is a condition caused by mutations in the SMN1 gene. Typically, individuals have two copies of the SMN1 gene, with one copy present on each chromosome. In SMA silent carriers, both copies of the SMN1 gene are present on one chromosome, with no copies of SMN1 present on the other chromosome.   SMA is characterized by progressive muscle weakness and atrophy due to degeneration and loss of anterior horn cells (lower motor neurons) in the spinal cord and brain stem. We discussed the different types of SMA (0, I, II, and III), including differences in severity and age of onset. We also reviewed the autosomal recessive inheritance pattern of SMA. We discussed that the couple only has a chance of having a child with SMA if both parents are identified as carriers for the condition. Based on the carrier frequency for SMA in the African American population, Beverly Dickson partner currently has a 1 in 81 chance of being a carrier of SMA. Without partner screening to refine risk and based on her partner's ethnicity, the couple currently has a 1 in 8976 (0.01%) risk of having a child with SMA. If Beverly Dickson's partner were found to have 2 copies of SMN1, his risk of being a carrier would be reduced but not eliminated. However, if both parents are carriers of SMA, there is a 1 in 4 (25%) chance of having an affected fetus.   Beverly Dickson carrier screening was negative for the other 13 conditions screened. Thus, her risk to be a carrier for these additional conditions (listed separately in the laboratory report)  has been reduced but not eliminated. This also significantly reduces her risk of having a child affected by one of these conditions. We discussed that carrier testing for SMA is recommended for Ms. Elkhatib's partner. Ms. Ohmer indicated that she is potentially interested in pursuing partner carrier screening.  We also reviewed that Ms. Milby had [facility] NIPS through the laboratory [facility] that was low-risk for fetal aneuploidies. We reviewed that these results showed a less than 1 in 10,000 risk for trisomies 21, 18 and 13, and monosomy X (Turner syndrome).  In addition, the risk for triploidy and sex chromosome trisomies (47,XXX and 47,XXY) was also low. Ms. Gunkel elected to have cfDNA analysis for 22q11.2 deletion syndrome, which was also low risk (1 in 9000). We reviewed that while this testing identifies 94-99% of pregnancies with trisomy 52, trisomy 76, trisomy 26, sex chromosome aneuploidies, and triploidy, it is NOT diagnostic. A positive test result requires confirmation by CVS or amniocentesis, and a negative test result does not rule out a fetal chromosome abnormality. She also understands that this testing does not identify all genetic conditions.  Ms. Johal was also counseled regarding diagnostic testing via amniocentesis. We discussed the technical aspects of the procedure and quoted up to a 1 in 500 (0.2%) risk for preterm delivery or other adverse pregnancy outcomes as a result of amniocentesis. Cultured cells from an amniocentesis sample allow for the visualization of a fetal karyotype, which can  detect >99% of chromosomal aberrations. Chromosomal microarray can also be performed to identify smaller deletions or duplications of fetal chromosomal material. Amniocentesis could also be performed to assess whether the baby is affected by SMA. After careful consideration, Ms. Conkright declined amniocentesis at this time. She understands that amniocentesis is available at any point after 16 weeks of pregnancy and  that she may opt to undergo the procedure at a later date should she change her mind.  Ms. Friddle was interested in pursuing SMA carrier screening for her partner, Liana Crocker. However, she wished to discuss this option with him prior to ordering testing. I provided her with my contact information and encouraged her to reach out to me if Mr. Derrell Lolling opts to pursue partner carrier screening. I can place the order and facilitate sample collection from there. I also informed Ms. Swoveland that SMA has recently been added to [place] [facility]'s newborn screening panel; thus, her child will be assessed for SMA within the first few days-weeks of life postnatally.   I counseled Ms. Wisnieski regarding the above risks and available options. The approximate face-to-face time with the genetic counselor was 30 minutes.  In summary:  Discussed carrier screening results and options for follow-up testing  Increased risk (1 in 48) to be silent carrier for spinal muscular atrophy  Recommend partner carrier screening. Patient will discuss this option with her partner and contact me if they are interested in testing  Reviewed low-risk NIPS result  Reduction in risk for Down syndrome, trisomy 70, trisomy 48, triploidy, sex chromosome aneuploidies, and 22q11.2 deletion syndrome  Offered additional testing and screening  Declined amniocentesis  Reviewed family history concerns  Family history of Haw-River syndrome. Patient reportedly had negative testing and genetic counseling performed at [facility], [place], [facility] Genetic Counselor

## 2020-02-09 ENCOUNTER — Encounter: Payer: Self-pay | Admitting: Nurse Practitioner

## 2020-02-09 ENCOUNTER — Other Ambulatory Visit: Payer: Self-pay

## 2020-02-09 ENCOUNTER — Telehealth (INDEPENDENT_AMBULATORY_CARE_PROVIDER_SITE_OTHER): Payer: Medicaid Other | Admitting: Nurse Practitioner

## 2020-02-09 DIAGNOSIS — Z3A34 34 weeks gestation of pregnancy: Secondary | ICD-10-CM

## 2020-02-09 DIAGNOSIS — O09293 Supervision of pregnancy with other poor reproductive or obstetric history, third trimester: Secondary | ICD-10-CM

## 2020-02-09 DIAGNOSIS — Z148 Genetic carrier of other disease: Secondary | ICD-10-CM

## 2020-02-09 DIAGNOSIS — Z8759 Personal history of other complications of pregnancy, childbirth and the puerperium: Secondary | ICD-10-CM

## 2020-02-09 DIAGNOSIS — Z3493 Encounter for supervision of normal pregnancy, unspecified, third trimester: Secondary | ICD-10-CM

## 2020-02-09 NOTE — Progress Notes (Signed)
I connected with@ on 02/09/20 at 11:15 AM EDT by:  and verified that I am speaking with the correct person using two identifiers.  Patient is located at home and provider is located at [facility] office.     The purpose of this virtual visit is to provide medical care while limiting exposure to the novel coronavirus. I discussed the limitations, risks, security and privacy concerns of performing an evaluation and management service by [facility] and the availability of in person appointments. I also discussed with the patient that there may be a patient responsible charge related to this service. By engaging in this virtual visit, you consent to the provision of healthcare.  Additionally, you authorize for your insurance to be billed for the services provided during this visit.  The patient expressed understanding and agreed to proceed.  The following staff members participated in the virtual visit:  Beverly Dickson, CMA and Beverly Bernheim, NP    PRENATAL VISIT NOTE  Subjective:  Beverly Dickson is a 29 y.o. G4P3003 at [redacted]w[redacted]d  for phone visit for ongoing prenatal care.  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  Patient reports occasional contractions.  Contractions: Not present. Vag. Bleeding: None.  Movement: Present. Denies leaking of fluid.   The following portions of the patient's history were reviewed and updated as appropriate: allergies, current medications, past family history, past medical history, past social history, past surgical history and problem list.   Objective:   Vitals:   02/09/20 1114  BP: 113/75  Pulse: 77   Self-Obtained  Fetal Status:     Movement: Present     Assessment and Plan:  Pregnancy: G4P3003 at [redacted]w[redacted]d 1. Encounter for supervision of low-risk pregnancy in third trimester Doing well and baby is moving well.   No headaches and no edema. Checking BP weekly.  Advised to log into [employer].  Client reports her BPs have been in normal range.  2. Genetic carrier Saw genetic counselor  3. History of prior pregnancy with SGA newborn [place] scheduled on 02-26-20  Preterm labor symptoms and general obstetric precautions including but not limited to vaginal bleeding, contractions, leaking of fluid and fetal movement were reviewed in detail with the patient.  Return in about 2 weeks (around 02/23/2020) for in person ROB - has [place] on 02-26-20 and was wondering if she could have her visit that day.  Future Appointments  Date Time Provider Department Center  02/26/2020  7:45 AM [facility] US5 [facility]     Time spent on virtual visit: 6 minutes  Currie Paris, NP

## 2020-02-09 NOTE — Progress Notes (Signed)
I connected with  [name] on 02/09/20 at 11:15 AM EDT by telephone and verified that I am speaking with the correct person using two identifiers.   I discussed the limitations, risks, security and privacy concerns of performing an evaluation and management service by telephone and the availability of in person appointments. I also discussed with the patient that there may be a patient responsible charge related to this service. The patient expressed understanding and agreed to proceed.  Janene Madeira Arlicia Paquette, CMA 02/09/2020  11:12 AM

## 2020-02-21 ENCOUNTER — Other Ambulatory Visit: Payer: Self-pay

## 2020-02-21 ENCOUNTER — Inpatient Hospital Stay (HOSPITAL_COMMUNITY)
Admission: EM | Admit: 2020-02-21 | Discharge: 2020-02-21 | Disposition: A | Discharge status: Home / Self Care | Payer: Medicaid Other | Attending: Obstetrics and Gynecology | Admitting: Obstetrics and Gynecology

## 2020-02-21 ENCOUNTER — Encounter (HOSPITAL_COMMUNITY): Payer: Self-pay | Admitting: Obstetrics and Gynecology

## 2020-02-21 DIAGNOSIS — O368130 Decreased fetal movements, third trimester, not applicable or unspecified: Secondary | ICD-10-CM | POA: Diagnosis present

## 2020-02-21 DIAGNOSIS — O368131 Decreased fetal movements, third trimester, fetus 1: Secondary | ICD-10-CM

## 2020-02-21 DIAGNOSIS — Z3A35 35 weeks gestation of pregnancy: Secondary | ICD-10-CM | POA: Insufficient documentation

## 2020-02-21 LAB — URINALYSIS, ROUTINE W REFLEX MICROSCOPIC
Bilirubin Urine: NEGATIVE
Glucose, UA: NEGATIVE mg/dL
Ketones, ur: NEGATIVE mg/dL
Nitrite: NEGATIVE
Protein, ur: 30 mg/dL — AB
Specific Gravity, Urine: 1.025 (ref 1.005–1.030)
pH: 5 (ref 5.0–8.0)

## 2020-02-21 NOTE — MAU Provider Note (Signed)
Chief Complaint  Patient presents with  . Decreased Fetal Movement     First Provider Initiated Contact with Patient 02/21/20 1021      S: Beverly Dickson  is a 29 y.o. y.o. year old G53P3003 female at [redacted]w[redacted]d weeks gestation who presents to MAU reporting decreased fetal movement since last night.   Contractions: Mild Vaginal bleeding: Denies Leaking of fluid: Denies  Patient Active Problem List   Diagnosis Date Noted  . 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 Supervision of low-risk pregnancy 09/04/2019  . History of prior pregnancy with SGA newborn 09/04/2019    O:  Patient Vitals for the past 24 hrs:  BP Temp Temp src Pulse Resp SpO2  02/21/20 1005 119/71 97.6 F (36.4 C) -- 90 18 100 %  02/21/20 0857 135/83 98 F (36.7 C) Oral 84 16 100 %   General: NAD Heart: Regular rate Lungs: Normal rate and effort Abd: Soft, NT, Gravid, S=D Pelvic: Declined.     NST performed EFM: 135, mod variability, 15x15 accels, no decels Toco: Q 5, painless  A: [redacted]w[redacted]d week IUP Decreased fetal movement resolved with reactive NST.  P: Discharge home in stable condition. Preterm labor precautions and fetal kick counts. Follow-up as scheduled for prenatal visit or sooner as needed if symptoms worsen. Return to maternity admissions as needed if symptoms worsen.  Katrinka Blazing, [place], CNM 02/21/2020 11:05 AM  2

## 2020-02-21 NOTE — MAU Note (Signed)
Pt presents to MAU from ED, due to decreased fetal movement since last night. She tried drinking fluids to get baby to move and still has not felt movement. Pt denies VB and LOF.

## 2020-02-21 NOTE — Discharge Instructions (Signed)
Fetal Movement Counts Patient Name: ________________________________________________ Patient Due Date: ____________________ What is a fetal movement count?  A fetal movement count is the number of times that you feel your baby move during a certain amount of time. This may also be called a fetal kick count. A fetal movement count is recommended for every pregnant woman. You may be asked to start counting fetal movements as early as week 28 of your pregnancy. Pay attention to when your baby is most active. You may notice your baby's sleep and wake cycles. You may also notice things that make your baby move more. You should do a fetal movement count:  When your baby is normally most active.  At the same time each day. A good time to count movements is while you are resting, after having something to eat and drink. How do I count fetal movements? 1. Find a quiet, comfortable area. Sit, or lie down on your side. 2. Write down the date, the start time and stop time, and the number of movements that you felt between those two times. Take this information with you to your health care visits. 3. Write down your start time when you feel the first movement. 4. Count kicks, flutters, swishes, rolls, and jabs. You should feel at least 10 movements. 5. You may stop counting after you have felt 10 movements, or if you have been counting for 2 hours. Write down the stop time. 6. If you do not feel 10 movements in 2 hours, contact your health care provider for further instructions. Your health care provider may want to do additional tests to assess your baby's well-being. Contact a health care provider if:  You feel fewer than 10 movements in 2 hours.  Your baby is not moving like he or she usually does. Date: ____________ Start time: ____________ Stop time: ____________ Movements: ____________ Date: ____________ Start time: ____________ Stop time: ____________ Movements: ____________ Date: ____________  Start time: ____________ Stop time: ____________ Movements: ____________ Date: ____________ Start time: ____________ Stop time: ____________ Movements: ____________ Date: ____________ Start time: ____________ Stop time: ____________ Movements: ____________ Date: ____________ Start time: ____________ Stop time: ____________ Movements: ____________ Date: ____________ Start time: ____________ Stop time: ____________ Movements: ____________ Date: ____________ Start time: ____________ Stop time: ____________ Movements: ____________ Date: ____________ Start time: ____________ Stop time: ____________ Movements: ____________ This information is not intended to replace advice given to you by your health care provider. Make sure you discuss any questions you have with your health care provider. Document Revised: 05/11/2019 Document Reviewed: 05/11/2019 Elsevier Patient Education  2020 Elsevier Inc.  

## 2020-02-21 NOTE — ED Provider Notes (Signed)
MSE was initiated and I personally evaluated the patient and placed orders (if any) at  9:15 AM on Feb 21, 2020.  The patient appears stable so that the remainder of the MSE may be completed by another provider.  9:15 AM patient is a 29 year old G4P3 who is currently about [redacted] weeks pregnant presents due to "not feeling her baby kick".  Typically she feels her child kicking every night and then well into the morning.  This typically keeps her up at night.  Her child was not kicking last night.  She drank some cold water this morning and "moved around" but has still not felt a kick.  She wants to be evaluated by an OB/GYN regarding this.  She notes some recent "off-white", "yellow" mild discharge for the past few days.  She otherwise has no complaints at this time.  I discussed with the on-call provider at MAU and they will accept the patient.   Placido Sou, PA-C 02/21/20 0919    Tegeler, Canary Brim, MD 02/21/20 [phone]

## 2020-02-25 ENCOUNTER — Inpatient Hospital Stay (HOSPITAL_COMMUNITY)
Admission: AD | Admit: 2020-02-25 | Discharge: 2020-02-27 | DRG: 805 | Disposition: A | Discharge status: Home / Self Care | Payer: Medicaid Other | Attending: Obstetrics and Gynecology | Admitting: Obstetrics and Gynecology

## 2020-02-25 ENCOUNTER — Encounter (HOSPITAL_COMMUNITY): Payer: Self-pay | Admitting: Obstetrics and Gynecology

## 2020-02-25 ENCOUNTER — Inpatient Hospital Stay (HOSPITAL_COMMUNITY): Payer: Medicaid Other | Admitting: Anesthesiology

## 2020-02-25 ENCOUNTER — Other Ambulatory Visit: Payer: Self-pay

## 2020-02-25 DIAGNOSIS — Z3A36 36 weeks gestation of pregnancy: Secondary | ICD-10-CM | POA: Diagnosis not present

## 2020-02-25 DIAGNOSIS — Z20822 Contact with and (suspected) exposure to covid-19: Secondary | ICD-10-CM | POA: Diagnosis present

## 2020-02-25 DIAGNOSIS — Z30017 Encounter for initial prescription of implantable subdermal contraceptive: Secondary | ICD-10-CM

## 2020-02-25 HISTORY — DX: Other specified health status: Z78.9

## 2020-02-25 LAB — CBC
HCT: 34.8 % — ABNORMAL LOW (ref 36.0–46.0)
Hemoglobin: 10.9 g/dL — ABNORMAL LOW (ref 12.0–15.0)
MCH: 25.6 pg — ABNORMAL LOW (ref 26.0–34.0)
MCHC: 31.3 g/dL (ref 30.0–36.0)
MCV: 81.9 fL (ref 80.0–100.0)
Platelets: 312 10*3/uL (ref 150–400)
RBC: 4.25 MIL/uL (ref 3.87–5.11)
RDW: 15.2 % (ref 11.5–15.5)
WBC: 9.6 10*3/uL (ref 4.0–10.5)
nRBC: 0 % (ref 0.0–0.2)

## 2020-02-25 LAB — TYPE AND SCREEN
ABO/RH(D): O POS
Antibody Screen: NEGATIVE

## 2020-02-25 LAB — SARS CORONAVIRUS 2 BY RT PCR (HOSPITAL ORDER, PERFORMED IN ~~LOC~~ HOSPITAL LAB): SARS Coronavirus 2: NEGATIVE

## 2020-02-25 MED ORDER — WITCH HAZEL-GLYCERIN EX PADS: 1.0000 "application " | MEDICATED_PAD | CUTANEOUS | Status: DC | PRN

## 2020-02-25 MED ORDER — LACTATED RINGERS IV SOLN: 500.0000 mL | Freq: Once | INTRAVENOUS | Status: DC

## 2020-02-25 MED ORDER — LIDOCAINE HCL (PF) 1 % IJ SOLN
INTRAMUSCULAR | Status: DC | PRN
  Administered 2020-02-25 (×2): 4 mL via EPIDURAL

## 2020-02-25 MED ORDER — DIPHENHYDRAMINE HCL 50 MG/ML IJ SOLN: 12.5000 mg | INTRAMUSCULAR | Status: DC | PRN

## 2020-02-25 MED ORDER — FERROUS SULFATE 325 (65 FE) MG PO TABS
325.0000 mg | ORAL_TABLET | Freq: Two times a day (BID) | ORAL | Status: DC
  Administered 2020-02-26 – 2020-02-27 (×3): 325 mg via ORAL
  Filled 2020-02-25 (×3): qty 1

## 2020-02-25 MED ORDER — IBUPROFEN 600 MG PO TABS
600.0000 mg | ORAL_TABLET | Freq: Four times a day (QID) | ORAL | Status: DC
  Administered 2020-02-25 – 2020-02-27 (×6): 600 mg via ORAL
  Filled 2020-02-25 (×7): qty 1

## 2020-02-25 MED ORDER — SOD CITRATE-CITRIC ACID 500-334 MG/5ML PO SOLN: 30.0000 mL | ORAL | Status: DC | PRN

## 2020-02-25 MED ORDER — DIBUCAINE (PERIANAL) 1 % EX OINT: 1.0000 "application " | TOPICAL_OINTMENT | CUTANEOUS | Status: DC | PRN

## 2020-02-25 MED ORDER — SODIUM CHLORIDE (PF) 0.9 % IJ SOLN
INTRAMUSCULAR | Status: DC | PRN
  Administered 2020-02-25: 12 mL/h via EPIDURAL

## 2020-02-25 MED ORDER — BUTORPHANOL TARTRATE 1 MG/ML IJ SOLN
1.0000 mg | Freq: Once | INTRAMUSCULAR | Status: AC
  Administered 2020-02-25: 1 mg via INTRAVENOUS
  Filled 2020-02-25: qty 1

## 2020-02-25 MED ORDER — SODIUM CHLORIDE 0.9 % IV SOLN
5.0000 10*6.[IU] | Freq: Once | INTRAVENOUS | Status: AC
  Administered 2020-02-25: 5 10*6.[IU] via INTRAVENOUS
  Filled 2020-02-25: qty 5

## 2020-02-25 MED ORDER — BENZOCAINE-MENTHOL 20-0.5 % EX AERO
1.0000 "application " | INHALATION_SPRAY | CUTANEOUS | Status: DC | PRN
  Administered 2020-02-25: 1 via TOPICAL
  Filled 2020-02-25: qty 56

## 2020-02-25 MED ORDER — EPHEDRINE 5 MG/ML INJ: 10.0000 mg | INTRAVENOUS | Status: DC | PRN

## 2020-02-25 MED ORDER — TETANUS-DIPHTH-ACELL PERTUSSIS 5-2.5-18.5 LF-MCG/0.5 IM SUSP: 0.5000 mL | Freq: Once | INTRAMUSCULAR | Status: DC

## 2020-02-25 MED ORDER — SIMETHICONE 80 MG PO CHEW: 80.0000 mg | CHEWABLE_TABLET | ORAL | Status: DC | PRN

## 2020-02-25 MED ORDER — PENICILLIN G POT IN DEXTROSE 60000 UNIT/ML IV SOLN: 3.0000 10*6.[IU] | INTRAVENOUS | Status: DC

## 2020-02-25 MED ORDER — ONDANSETRON HCL 4 MG PO TABS: 4.0000 mg | ORAL_TABLET | ORAL | Status: DC | PRN

## 2020-02-25 MED ORDER — LACTATED RINGERS IV SOLN: INTRAVENOUS | Status: DC

## 2020-02-25 MED ORDER — MEASLES, MUMPS & RUBELLA VAC IJ SOLR: 0.5000 mL | Freq: Once | INTRAMUSCULAR | Status: DC

## 2020-02-25 MED ORDER — LACTATED RINGERS IV BOLUS
1000.0000 mL | Freq: Once | INTRAVENOUS | Status: AC
  Administered 2020-02-25: 1000 mL via INTRAVENOUS

## 2020-02-25 MED ORDER — OXYTOCIN 40 UNITS IN NORMAL SALINE INFUSION - SIMPLE MED: 2.5000 [IU]/h | INTRAVENOUS | Status: DC

## 2020-02-25 MED ORDER — PRENATAL MULTIVITAMIN CH
1.0000 | ORAL_TABLET | Freq: Every day | ORAL | Status: DC
  Administered 2020-02-26 – 2020-02-27 (×2): 1 via ORAL
  Filled 2020-02-25 (×2): qty 1

## 2020-02-25 MED ORDER — COCONUT OIL OIL
1.0000 "application " | TOPICAL_OIL | Status: DC | PRN
  Administered 2020-02-25: 1 via TOPICAL

## 2020-02-25 MED ORDER — LACTATED RINGERS IV SOLN: 500.0000 mL | INTRAVENOUS | Status: DC | PRN

## 2020-02-25 MED ORDER — PHENYLEPHRINE 40 MCG/ML (10ML) SYRINGE FOR IV PUSH (FOR BLOOD PRESSURE SUPPORT)
80.0000 ug | PREFILLED_SYRINGE | INTRAVENOUS | Status: DC | PRN
  Filled 2020-02-25: qty 10

## 2020-02-25 MED ORDER — ONDANSETRON HCL 4 MG/2ML IJ SOLN: 4.0000 mg | INTRAMUSCULAR | Status: DC | PRN

## 2020-02-25 MED ORDER — PROMETHAZINE HCL 25 MG/ML IJ SOLN
25.0000 mg | Freq: Four times a day (QID) | INTRAMUSCULAR | Status: DC | PRN
  Administered 2020-02-25: 25 mg via INTRAVENOUS
  Filled 2020-02-25: qty 1

## 2020-02-25 MED ORDER — LIDOCAINE HCL (PF) 1 % IJ SOLN: 30.0000 mL | INTRAMUSCULAR | Status: DC | PRN

## 2020-02-25 MED ORDER — FENTANYL-BUPIVACAINE-NACL 0.5-0.125-0.9 MG/250ML-% EP SOLN
12.0000 mL/h | EPIDURAL | Status: DC | PRN
  Filled 2020-02-25: qty 250

## 2020-02-25 MED ORDER — DIPHENHYDRAMINE HCL 25 MG PO CAPS: 25.0000 mg | ORAL_CAPSULE | Freq: Four times a day (QID) | ORAL | Status: DC | PRN

## 2020-02-25 MED ORDER — ONDANSETRON HCL 4 MG/2ML IJ SOLN: 4.0000 mg | Freq: Four times a day (QID) | INTRAMUSCULAR | Status: DC | PRN

## 2020-02-25 MED ORDER — ACETAMINOPHEN 325 MG PO TABS: 650.0000 mg | ORAL_TABLET | ORAL | Status: DC | PRN

## 2020-02-25 MED ORDER — OXYTOCIN 40 UNITS IN NORMAL SALINE INFUSION - SIMPLE MED
INTRAVENOUS | Status: AC
  Filled 2020-02-25: qty 1000

## 2020-02-25 MED ORDER — MAGNESIUM HYDROXIDE 400 MG/5ML PO SUSP: 30.0000 mL | ORAL | Status: DC | PRN

## 2020-02-25 MED ORDER — PHENYLEPHRINE 40 MCG/ML (10ML) SYRINGE FOR IV PUSH (FOR BLOOD PRESSURE SUPPORT): 80.0000 ug | PREFILLED_SYRINGE | INTRAVENOUS | Status: DC | PRN

## 2020-02-25 MED ORDER — OXYTOCIN BOLUS FROM INFUSION
500.0000 mL | Freq: Once | INTRAVENOUS | Status: AC
  Administered 2020-02-25: 500 mL via INTRAVENOUS

## 2020-02-25 MED ORDER — ACETAMINOPHEN 325 MG PO TABS
650.0000 mg | ORAL_TABLET | ORAL | Status: DC | PRN
  Administered 2020-02-26 – 2020-02-27 (×3): 650 mg via ORAL
  Filled 2020-02-25 (×3): qty 2

## 2020-02-25 NOTE — Anesthesia Preprocedure Evaluation (Signed)
Anesthesia Evaluation  Patient identified by MRN, date of birth, ID band Patient awake    Reviewed: Allergy & Precautions, Patient's Chart, lab work & pertinent test results  History of Anesthesia Complications Negative for: history of anesthetic complications  Airway Mallampati: II  TM Distance: >3 FB Neck ROM: Full    Dental no notable dental hx.    Pulmonary neg pulmonary ROS,    Pulmonary exam normal        Cardiovascular negative cardio ROS Normal cardiovascular exam     Neuro/Psych negative neurological ROS  negative psych ROS   GI/Hepatic negative GI ROS, Neg liver ROS,   Endo/Other  negative endocrine ROS  Renal/GU negative Renal ROS  negative genitourinary   Musculoskeletal negative musculoskeletal ROS (+)   Abdominal   Peds  Hematology negative hematology ROS (+)   Anesthesia Other Findings Day of surgery medications reviewed with patient.  Reproductive/Obstetrics (+) Pregnancy                             Anesthesia Physical Anesthesia Plan  ASA: II  Anesthesia Plan: Epidural   Post-op Pain Management:    Induction:   PONV Risk Score and Plan: Treatment may vary due to age or medical condition  Airway Management Planned: Natural Airway  Additional Equipment:   Intra-op Plan:   Post-operative Plan:   Informed Consent: I have reviewed the patients History and Physical, chart, labs and discussed the procedure including the risks, benefits and alternatives for the proposed anesthesia with the patient or authorized representative who has indicated his/her understanding and acceptance.       Plan Discussed with:   Anesthesia Plan Comments:         Anesthesia Quick Evaluation  

## 2020-02-25 NOTE — MAU Provider Note (Signed)
  S: [name] is a 29 y.o. [phone] at [redacted]w[redacted]d  who presents to MAU today complaining contractions q 3-8 minutes since 0830 this morning. She endorses bloody show. She denies LOF. She reports normal fetal movement.    O: BP 130/81   Pulse 78   Resp 20   LMP 06/09/2019 (Approximate)   SpO2 100% Comment: room air GENERAL: Well-developed, well-nourished female in no acute distress.  HEAD: Normocephalic, atraumatic.  CHEST: Normal effort of breathing, regular heart rate ABDOMEN: Soft, nontender, gravid  Cervical exam:  Dilation: 4.5 Effacement (%): 80 Cervical Position: Posterior Station: -2 Presentation: Vertex Exam by:: Thalia Bloodgood CNM   Fetal Monitoring: Baseline: 130 Variability: Mod Accelerations: 10 x 10, occasional 15 x 15 Decelerations: Variable Contractions: Irregular   A: SIUP at [redacted]w[redacted]d  4/70/-2 per initial RN exam  1330:  --CNM at bedside for assessment given patient vocalizations during contractions --4.5/80/-2 with small bulging bag per my exam, likely difference between assessors --Cat II tracing for occasional variables, IV fluid bolus ordered  1400 --Returned to bedside, SVE due to patient report of tailbone pressure during contractions --SVE unchanged from previous --Overall reassuring tracing  1500 --Returned to bedside, SVE unchanged --Dr. Alysia Penna notified of Cat II tracing, lack of cervical change in setting of GA --Dr. Alysia Penna agreeable to offering therapeutic rest with reevaluation in 1-2 hours as tolerated by patient  1500-1630 --In department, patient with improved coping, no longer vocalizing --Discussed plan with RN: recheck cervix at 1700  1645 --Returned to bedside  --Now 5/80 per my VE --Continues to have Cat II tracing and pain 9/10 --Discussed with Dr. Alysia Penna who agrees with my recommendation for admission to L&D --Plan to augment PRN once 4 hours s/p PCN for GBS prophylaxis  Clayton Bibles, CNM 02/25/2020 5:05 PM

## 2020-02-25 NOTE — Discharge Instructions (Signed)

## 2020-02-25 NOTE — Discharge Summary (Addendum)
Postpartum Discharge Summary     Patient Name: Beverly Dickson DOB: 11/20/1990 MRN: [id number]  Date of admission: 02/25/2020 Delivery date:02/25/2020  Delivering provider: Darlina Rumpf  Date of discharge: 02/27/2020  Admitting diagnosis: Preterm labor [O60.00] Intrauterine pregnancy: [redacted]w[redacted]d    Secondary diagnosis:  Active Problems:   Preterm labor   Encounter for initial prescription of implantable subdermal contraceptive  Additional problems: none    Discharge diagnosis: Preterm Pregnancy Delivered                                              Post partum procedures:N/A Augmentation: AROM  Complications: None  Hospital course: Onset of Labor With Vaginal Delivery      29y.o. yo [id number] 30w2das admitted in Active Labor on 02/25/2020. Patient had an uncomplicated labor course as follows:  Membrane Rupture Time/Date: 7:36 PM ,02/25/2020   Delivery Method:Vaginal, Spontaneous  Episiotomy: None  Lacerations:  None  Patient had an uncomplicated postpartum course.  She is ambulating, tolerating a regular diet, passing flatus, and urinating well. Patient is discharged home in stable condition on 02/27/20.  Newborn Data: Birth date:02/25/2020  Birth time:7:42 PM  Gender:Female  Living status:Living  Apgars:9 ,9  Weight:2571 g   Magnesium Sulfate received: No BMZ received: No Rhophylac:N/A MMR:N/A T-DaP:Given prenatally Flu: N/A Transfusion:No  Physical exam  Vitals:   02/26/20 1400 02/26/20 1425 02/26/20 2041 02/27/20 0430  BP: 117/68 (!) 114/58 119/66 120/66  Pulse: 89 88 86 82  Resp: _0 Temp: 98.3 F (36.8 C) 98.4 F (36.9 C) 98.6 F (37 C) 98.6 F (37 C)  TempSrc: Oral Oral Axillary   SpO2: 99%  100% 100%  Weight:      Height:       General: alert, cooperative and no distress Lochia: appropriate Uterine Fundus: firm Incision: N/A DVT Evaluation: No evidence of DVT seen on physical exam. Labs: Lab Results  Component Value Date   WBC  9.6 02/25/2020   HGB 10.9 (L) 02/25/2020   HCT 34.8 (L) 02/25/2020   MCV 81.9 02/25/2020   PLT 312 02/25/2020   CMP Latest Ref Rng & Units 07/28/2019  Glucose 70 - 99 mg/dL 83  BUN 6 - 20 mg/dL 9  Creatinine 0.44 - 1.00 mg/dL 0.69  Sodium 135 - 145 mmol/L 134(L)  Potassium 3.5 - 5.1 mmol/L 3.8  Chloride 98 - 111 mmol/L 103  CO2 22 - 32 mmol/L 21(L)  Calcium 8.9 - 10.3 mg/dL 9.7  Total Protein 6.5 - 8.1 g/dL 7.5  Total Bilirubin 0.3 - 1.2 mg/dL 0.5  Alkaline Phos 38 - 126 U/L 53  AST 15 - 41 U/L 15  ALT 0 - 44 U/L 13   Edinburgh Score: Edinburgh Postnatal Depression Scale Screening Tool 02/27/2020  I have been able to laugh and see the funny side of things. 0  I have looked forward with enjoyment to things. 0  I have blamed myself unnecessarily when things went wrong. 0  I have been anxious or worried for no good reason. 0  I have felt scared or panicky for no good reason. 0  Things have been getting on top of me. 0  I have been so unhappy that I have had difficulty sleeping. 0  I have felt sad or miserable. 0  I have been so unhappy that  I have been crying. 0  The thought of harming myself has occurred to me. 0  Edinburgh Postnatal Depression Scale Total 0     After visit meds:  Allergies as of 02/27/2020      Reactions   Other    Nickel: Rash       Medication List    TAKE these medications   clobetasol ointment 0.05 % Commonly known as: TEMOVATE Apply 1 application topically 2 (two) times daily.   diphenhydrAMINE 25 mg capsule Commonly known as: BENADRYL Take 25 mg by mouth every 6 (six) hours as needed for allergies.   famotidine 20 MG tablet Commonly known as: Pepcid Take 1 tablet (20 mg total) by mouth 2 (two) times daily.   fluticasone 50 MCG/ACT nasal spray Commonly known as: FLONASE Place 1 spray into both nostrils daily.   ibuprofen 600 MG tablet Commonly known as: ADVIL Take 1 tablet (600 mg total) by mouth every 6 (six) hours.    levocetirizine 5 MG tablet Commonly known as: XYZAL Take 5 mg by mouth every evening.   pantoprazole 40 MG tablet Commonly known as: Protonix Take 1 tablet (40 mg total) by mouth daily.   PrePLUS 27-1 MG Tabs Take 1 tablet by mouth daily.       Discharge home in stable condition Infant Feeding: Breast Infant Disposition:home with mother Discharge instruction: per After Visit Summary and Postpartum booklet. Activity: Advance as tolerated. Pelvic rest for 6 weeks.  Diet: routine diet Future Appointments: Future Appointments  Date Time Provider [place]  03/28/2020  2:55 PM Rasch, Artist Pais, NP [facility]   Follow up Visit (message sent 02/25/2020):   Please schedule this patient for a Virtual postpartum visit in 4 weeks with the following provider: Any provider. Additional Postpartum F/U:N/A  Low risk pregnancy complicated by: Preterm labor and delivery Delivery mode:  Vaginal, Spontaneous  Anticipated Birth Control:  Nexplanon   02/27/2020 Chauncey Mann, MD

## 2020-02-25 NOTE — H&P (Signed)
OBSTETRIC ADMISSION HISTORY AND PHYSICAL  Beverly Dickson is a 28 y.o. female [phone] with IUP at [redacted]w[redacted]d by LMP presenting for IOL for early labor/NRFHT. She reports +FMs, No LOF, no VB, no blurry vision, headaches or peripheral edema, and RUQ pain.  She plans on breast feeding. She request nexplanon for birth control. She received her [facility] at [facility]   Dating: By LMP --->  Estimated Date of Delivery: 03/22/20  Sono:    @[redacted]w[redacted]d , CWD, normal anatomy, cephalic presentation, 9371I, 35% EFW  Nursing Staff Provider  Office Location  [facility] Dating   LMP, c/w u/s  Language   English Anatomy US  Normal - incomplete - FU normal  Flu Vaccine  declined Genetic Screen  NIPS: low risk female AFP:   negative  TDaP vaccine  12/27/2019 Hgb A1C or  GTT Early  A1C 5.1 Third trimester   Ref. Range 12/27/2019 09:01  Glucose, 1 hour Latest Ref Range: 65 - 179 mg/dL 139  Glucose, Fasting Latest Ref Range: 65 - 91 mg/dL 69  Glucose, 2 hour Latest Ref Range: 65 - 152 mg/dL 97    Rhogam   NA   LAB RESULTS   Feeding Plan  Breast Blood Type O/Positive/-- (12/02 1552)   Contraception  Nexplanon Antibody Negative (12/02 1552)  Circumcision  Yes, inpatient Rubella 10.50 (12/02 1552)  Pediatrician   [facility] RPR Non Reactive (12/02 1552)   Support Person  Dujuan HBsAg Negative (12/02 1552)   Prenatal Classes  offered HIV Non Reactive (12/02 1552)  BTL Consent na GBS  (For PCN allergy, check sensitivities)   VBAC Consent  N/A Pap  2019, normal per pt, need records    Hgb Electro   negative  BP Cuff  has one CF negative    SMA  carrier    Waterbirth  [ ]  Class [ ]  Consent [ ]  CNM visit   Prenatal History/Complications: hx of SGA infant  Past Medical History: Past Medical History:  Diagnosis Date  . UTI (urinary tract infection)     Past Surgical History: Past Surgical History:  Procedure Laterality Date  . NO PAST SURGERIES      Obstetrical History: OB History    Gravida  4   Para  3    Term  3   Preterm  0   AB  0   Living  3     SAB  0   TAB  0   Ectopic  0   Multiple  0   Live Births  3           Social History Social History   Socioeconomic History  . Marital status: Single    Spouse name: Not on file  . Number of children: Not on file  . Years of education: Not on file  . Highest education level: Not on file  Occupational History  . Not on file  Tobacco Use  . Smoking status: Never Smoker  . Smokeless tobacco: Never Used  Substance and Sexual Activity  . Alcohol use: Never  . Drug use: Not Currently    Types: Marijuana    Comment: end of October  . Sexual activity: Yes    Comment: was on lutera  Other Topics Concern  . Not on file  Social History Narrative  . Not on file   Social Determinants of Health   Financial Resource Strain:   . Difficulty of Paying Living Expenses:   Food Insecurity: No Food Insecurity  .  Worried About Programme researcher, broadcasting/film/video in the Last Year: Never true  . Ran Out of Food in the Last Year: Never true  Transportation Needs: No Transportation Needs  . Lack of Transportation (Medical): No  . Lack of Transportation (Non-Medical): No  Physical Activity:   . Days of Exercise per Week:   . Minutes of Exercise per Session:   Stress:   . Feeling of Stress :   Social Connections:   . Frequency of Communication with Friends and Family:   . Frequency of Social Gatherings with Friends and Family:   . Attends Religious Services:   . Active Member of Clubs or Organizations:   . Attends Banker Meetings:   Marland Kitchen Marital Status:     Family History: Family History  Adopted: Yes  Family history unknown: Yes    Allergies: Allergies  Allergen Reactions  . Other     Nickel: Rash     Medications Prior to Admission  Medication Sig Dispense Refill Last Dose  . clobetasol ointment (TEMOVATE) 0.05 % Apply 1 application topically 2 (two) times daily. 30 g 1   . diphenhydrAMINE (BENADRYL) 25 mg capsule  Take 25 mg by mouth every 6 (six) hours as needed for allergies.     . famotidine (PEPCID) 20 MG tablet Take 1 tablet (20 mg total) by mouth 2 (two) times daily. 60 tablet 1   . fluticasone (FLONASE) 50 MCG/ACT nasal spray Place 1 spray into both nostrils daily. 16 g 2   . levocetirizine (XYZAL) 5 MG tablet Take 5 mg by mouth every evening.     . pantoprazole (PROTONIX) 40 MG tablet Take 1 tablet (40 mg total) by mouth daily. 30 tablet 4   . Prenatal Vit-Fe Fumarate-FA (PREPLUS) 27-1 MG TABS Take 1 tablet by mouth daily. 30 tablet 13    Review of Systems   All systems reviewed and negative except as stated in HPI  Blood pressure 130/81, pulse 78, resp. rate 20, last menstrual period 06/09/2019, SpO2 100 %. General appearance: alert, cooperative and mild distress Lungs: clear to auscultation bilaterally Heart: regular rate and rhythm Abdomen: soft, non-tender; bowel sounds normal Pelvic: n/a Extremities: Homans sign is negative, no sign of DVT DTR's +2 Presentation: cephalic Fetal monitoringBaseline: 130 bpm, Variability: Fair (1-6 bpm), Accelerations: none and Decelerations: Variable: repeated Uterine activityFrequency: Every 5-7 minutes Dilation: 4.5 Effacement (%): 80 Station: -2 Exam by:: Thalia Bloodgood CNM   Prenatal labs: ABO, Rh: --/--/O POS (05/23 1340) Antibody: NEG (05/23 1340) Rubella: 10.50 (12/02 1552) RPR: Non Reactive (03/24 0901)  HBsAg: Negative (12/02 1552)  HIV: Non Reactive (03/24 0901)  GBS:   unknown  Prenatal Transfer Tool  Maternal Diabetes: No Genetic Screening: Abnormal:  Results: Other:increased carrier risk SMA Maternal Ultrasounds/Referrals: Normal Fetal Ultrasounds or other Referrals:  Referred to Materal Fetal Medicine  Maternal Substance Abuse:  No Significant Maternal Medications:  None Significant Maternal Lab Results: None  Results for orders placed or performed during the hospital encounter of 02/25/20 (from the past 24 hour(s))  Type  and screen   Collection Time: 02/25/20  1:40 PM  Result Value Ref Range   ABO/RH(D) O POS    Antibody Screen NEG    Sample Expiration      02/28/2020,2359 Performed at [facility], [address], [place], [place] [address]   CBC   Collection Time: 02/25/20  1:55 PM  Result Value Ref Range   WBC 9.6 4.0 - 10.5 K/uL  RBC 4.25 3.87 - 5.11 MIL/uL   Hemoglobin 10.9 (L) 12.0 - 15.0 g/dL   HCT 24.2 (L) 35.3 - 61.4 %   MCV 81.9 80.0 - 100.0 fL   MCH 25.6 (L) 26.0 - 34.0 pg   MCHC 31.3 30.0 - 36.0 g/dL   RDW 43.1 54.0 - 08.6 %   Platelets 312 150 - 400 K/uL   nRBC 0.0 0.0 - 0.2 %    Patient Active Problem List   Diagnosis Date Noted  . 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 Supervision of low-risk pregnancy 09/04/2019  . History of prior pregnancy with SGA newborn 09/04/2019    Assessment/Plan:  Rabecca Birge is a 29 y.o. [phone] at [redacted]w[redacted]d here for IOL due to NRFHT and early preterm labor  #Labor: pitocin after adequate treatment for GBS prophlaxis #Pain: epidural #FWB: Cat 2, Dr. Alysia Penna aware #ID:  GBS unknown, PCN #MOF: breast #MOC:Nexplanon #Circ:  yes  Rolm Bookbinder, CNM  02/25/2020, 4:47 PM

## 2020-02-25 NOTE — MAU Note (Signed)
Beverly Dickson is a 29 y.o. at [redacted]w[redacted]d here in MAU reporting:  Contractions. Initally the patient states the contractions were 8-9 minutes apart. Now they are 3-4 minutes apart. Denies LOF. Endorses bloody show. Onset of complaint: 830-900 am  Pain score: 9/10 Breathing with contractions   FHT:+FM Lab orders placed from triage: mau labor order set

## 2020-02-25 NOTE — Anesthesia Procedure Notes (Signed)
Epidural Patient location during procedure: OB Start time: 02/25/2020 5:28 PM End time: 02/25/2020 5:31 PM  Staffing Anesthesiologist: Kaylyn Layer, MD Performed: anesthesiologist   Preanesthetic Checklist Completed: patient identified, IV checked, risks and benefits discussed, monitors and equipment checked, pre-op evaluation and timeout performed  Epidural Patient position: sitting Prep: DuraPrep and site prepped and draped Patient monitoring: continuous pulse ox, blood pressure and heart rate Approach: midline Location: L3-L4 Injection technique: LOR air  Needle:  Needle type: Tuohy  Needle gauge: 17 G Needle length: 9 cm Needle insertion depth: 7 cm Catheter type: closed end flexible Catheter size: 19 Gauge Catheter at skin depth: 12 cm Test dose: negative and Other (1% lidocaine)  Assessment Events: blood not aspirated, injection not painful, no injection resistance, no paresthesia and negative IV test  Additional Notes Patient identified. Risks, benefits, and alternatives discussed with patient including but not limited to bleeding, infection, nerve damage, paralysis, failed block, incomplete pain control, headache, blood pressure changes, nausea, vomiting, reactions to medication, itching, and postpartum back pain. Confirmed with bedside nurse the patient's most recent platelet count. Confirmed with patient that they are not currently taking any anticoagulation, have any bleeding history, or any family history of bleeding disorders. Patient expressed understanding and wished to proceed. All questions were answered. Sterile technique was used throughout the entire procedure. Please see nursing notes for vital signs.   Crisp LOR after one needle redirection. Test dose was given through epidural catheter and negative prior to continuing to dose epidural or start infusion. Warning signs of high block given to the patient including shortness of breath, tingling/numbness in  hands, complete motor block, or any concerning symptoms with instructions to call for help. Patient was given instructions on fall risk and not to get out of bed. All questions and concerns addressed with instructions to call with any issues or inadequate analgesia.  Reason for block:procedure for pain

## 2020-02-26 ENCOUNTER — Encounter: Payer: Medicaid Other | Admitting: Advanced Practice Midwife

## 2020-02-26 ENCOUNTER — Ambulatory Visit: Payer: Medicaid Other

## 2020-02-26 DIAGNOSIS — Z30017 Encounter for initial prescription of implantable subdermal contraceptive: Secondary | ICD-10-CM

## 2020-02-26 LAB — RPR: RPR Ser Ql: NONREACTIVE

## 2020-02-26 MED ORDER — ETONOGESTREL 68 MG ~~LOC~~ IMPL
68.0000 mg | DRUG_IMPLANT | Freq: Once | SUBCUTANEOUS | Status: AC
  Administered 2020-02-26: 68 mg via SUBCUTANEOUS
  Filled 2020-02-26: qty 1

## 2020-02-26 MED ORDER — LIDOCAINE HCL 1 % IJ SOLN
0.0000 mL | Freq: Once | INTRAMUSCULAR | Status: AC | PRN
  Administered 2020-02-26: 20 mL via INTRADERMAL
  Filled 2020-02-26: qty 20

## 2020-02-26 NOTE — Lactation Note (Signed)
This note was copied from a baby's chart. Lactation Consultation Note Baby 24 hrs old. Mom isn't giving baby any supplementation of EBM after BF. Mom states she hears the baby swallowing and feels the let down so she knows he's getting a lot of milk. Mom stated the baby doesn't like the spoon or syring and she isn't going to force him to take anything if he doesn't want it. Mom stated he only like feeding from her breast. Encouraged mom to massage breast during feeding so the baby will get more.  Also encouraged mom to assess for transfer after feeding. Discussed LPI behavior and feeding habits.  Asked mom if she is pumping mom stated no because it hurts. Mom stated she had it on 4 and it hurt. Assessed flange size. Mom stated she doesn't like pumping.  Asked mom to call LC for next feeding so LC can assess transfer and baby's feeding. Discussed weight loss w/LPI and need for extra stimulation. Mom feels her breast are being well stimulated by baby and feels he is doing great.   Patient Name: Beverly Dickson [id number] Date: 02/26/2020 Reason for consult: Follow-up assessment;Infant < 6lbs;Late-preterm 34-36.6wks   Maternal Data    Feeding Feeding Type: Breast Fed  LATCH Score Latch: Repeated attempts needed to sustain latch, nipple held in mouth throughout feeding, stimulation needed to elicit sucking reflex.  Audible Swallowing: A few with stimulation  Type of Nipple: Everted at rest and after stimulation  Comfort (Breast/Nipple): Soft / non-tender  Hold (Positioning): No assistance needed to correctly position infant at breast.  LATCH Score: 8  Interventions Interventions: Breast feeding basics reviewed  Lactation Tools Discussed/Used     Consult Status Consult Status: Follow-up Date: 02/26/20(asked mom to call for next feeding) Follow-up type: In-patient    Charyl Dancer 02/26/2020, 8:36 PM

## 2020-02-26 NOTE — Lactation Note (Addendum)
This note was copied from a baby's chart. Lactation Consultation Note  Patient Name: Beverly Dickson [id number] Date: 02/26/2020 Reason for consult: Initial assessment;Other (Comment);Infant weight loss;Late-preterm 34-36.6wks;Infant < 6lbs(1 % weight loss/ Birth weight - 5- 10 .7 oz)  Hx of THC use - UDS negative  Baby is 15 hours old - Baby Beverly " Dujuan " per mom.  Per mom last fed at 10 am for 10 mins with swallows and latch was comfortable.  Presently the baby is asleep.  DEBP was set up by the [facility] and per mom has pumped x 1, and syringe fed back to baby.  Baby had one low blood sugar of 39 and 2 good ones since.  Mom is an experienced BF of 3 others ( see doc flow sheets ) and has a DEBP at home , active with [facility] . Per mom the last baby was early 38 weeks.  LC reviewed LPT , less than 6 pounds potential feeding behaviors and the importance of feeding with feeding cues and by 3 hours 8-12 times a day. Extra pumping is indicated due to LPT and less than 6 pounds to bring the mature milk in quicker to protect baby's energy level so the baby won't loose to much weight.  Since the baby recently fed and not showing feeding cues at this time , LC asked mom to call with feeding cues for latch assessment.  LC provided the LPT feeding guide lines and the LC pamphlet.     Maternal Data Has patient been taught Hand Expression?: Yes Does the patient have breastfeeding experience prior to this delivery?: Yes  Feeding Feeding Type: (per mom breast fed last at 1000 for 10 mins)  LATCH Score                   Interventions Interventions: Breast feeding basics reviewed  Lactation Tools Discussed/Used Tools: Pump;Flanges Breast pump type: Double-Electric Breast Pump WIC Program: Yes   Consult Status Consult Status: Follow-up Date: 02/26/20 Follow-up type: In-patient    Matilde Sprang Florance Paolillo 02/26/2020, 11:28 AM

## 2020-02-26 NOTE — Progress Notes (Addendum)
Post Partum Day 1  Subjective:  Beverly Dickson is a 29 y.o. [id number] [redacted]w[redacted]d s/p VD.  No acute events overnight.  Pt denies problems with ambulating, voiding or po intake.  She denies nausea or vomiting.  Pain is well controlled.  She has had flatus. She has had bowel movement.  Lochia Moderate.  Plan for birth control is  Nexplanon .  Method of Feeding: breast feeding.   Objective: BP (!) 119/56 (BP Location: Left Arm)   Pulse 86   Temp 97.9 F (36.6 C) (Oral)   Resp 16   Ht 5\' 1"  (1.549 m)   Wt 75.8 kg   LMP 06/09/2019 (Approximate)   SpO2 99%   Breastfeeding Unknown   BMI 31.55 kg/m   Physical Exam:  General: alert, cooperative and no distress Lochia:normal flow Chest: CTAB Heart: RRR no m/r/g Abdomen: +BS, soft, nontender, fundus firm at umbilicus Uterine Fundus: firm,  DVT Evaluation: No evidence of DVT seen on physical exam. Extremities: no LE edema  Recent Labs    02/25/20 1355  HGB 10.9*  HCT 34.8*    Assessment/Plan:  ASSESSMENT: Beverly Dickson is a 29 y.o. 37 [redacted]w[redacted]d ppd #1 s/p VD doing well.   Plan for discharge tomorrow, Circumcision prior to discharge and Contraception nexplanon  to be placed  #Circumcision if infant cleared by pediatrics given inadequate GBS ppx    LOS: 1 day   [redacted]w[redacted]d 02/26/2020, 8:00 AM   GME ATTESTATION:  I saw and evaluated the patient. I agree with the findings and the plan of care as documented in the resident's note.  02/28/2020, DO OB Fellow, Faculty [facility], [facility] for [facility] 02/26/2020 8:14 AM

## 2020-02-26 NOTE — Anesthesia Postprocedure Evaluation (Signed)
Anesthesia Post Note  Patient: Beverly Dickson  Procedure(s) Performed: AN AD HOC LABOR EPIDURAL     Patient location during evaluation: Mother Baby Anesthesia Type: Epidural Level of consciousness: awake, awake and alert and oriented Pain management: pain level controlled Vital Signs Assessment: post-procedure vital signs reviewed and stable Respiratory status: spontaneous breathing Cardiovascular status: blood pressure returned to baseline and stable Postop Assessment: no headache, no backache, patient able to bend at knees, no apparent nausea or vomiting, adequate PO intake and able to ambulate Anesthetic complications: no    Last Vitals:  Vitals:   02/26/20 0400 02/26/20 0830  BP: (!) 119/56 115/63  Pulse: 86 (!) 104  Resp: 16 18  Temp: 36.6 C 36.7 C  SpO2: 99% 100%    Last Pain:  Vitals:   02/26/20 0830  TempSrc: Oral  PainSc:    Pain Goal: Patients Stated Pain Goal: 0 (02/25/20 1714)                 Jennelle Human

## 2020-02-26 NOTE — Progress Notes (Signed)
CSW received consult for hx of marijuana use.  Referral was screened out due to the following: ~MOB had no documented substance use after initial prenatal visit/+UPT. ~MOB had no positive drug screens after initial prenatal visit/+UPT. ~Baby's UDS is negative.  Please consult CSW if current concerns arise or by MOB's request.  CSW will monitor CDS results and make report to Child Protective Services if warranted.   Beverly Dickson, MSW, LCSW [facility] [phone]    

## 2020-02-26 NOTE — Procedures (Signed)

## 2020-02-27 LAB — CULTURE, BETA STREP (GROUP B ONLY)

## 2020-02-27 MED ORDER — IBUPROFEN 600 MG PO TABS: 600.0000 mg | ORAL_TABLET | Freq: Four times a day (QID) | ORAL | 0 refills | Status: AC

## 2020-02-27 NOTE — Lactation Note (Signed)
This note was copied from a baby's chart. Lactation Consultation Note  Patient Name: Beverly Dickson Height [facility] Date: 02/27/2020 Reason for consult: Follow-up assessment  P4 mother whose infant is now 18 hours old.  This is a LPTI at 36+2 weeks with a CGA of 36+4 weeks.    Mother has been breast feeding only without giving any supplementation or pumping.  She is not interested in doing any supplementation and informed me that her baby is latching and feeding well.  She has observed swallows.  He fed approximately 45 minutes ago and was quiet and awake when I visited with mother.  Reviewed latching and how to actively keep him engaged during feedings.  Reminded mother to feed STS.  Reviewed supplementation guideline volumes, however, mother still does not wish to give any supplementation of any kind.  She does not want to syringe feed or spoon feed any extra volume.  I suggested using a bottle nipple and discussed the benefits of doing this for baby but mother politely declined.  She desires no help or assistance with breast feeding or supplementing.  Baby has a current weight loss of 4% and weighs < 6 lbs.  Discussed with mother the importance of feeding on cue or at least every three hours due to gestational age and size.  Mother verbalized understanding.    Questioned mother about her pumping and she stated she has only pumped one time.  Mother stated, "It hurts."  I offered to observe/assist with pumping, informing her that pumping should not hurt and I could help alleviate this for her.  Mother declined.    Engorgement prevention/treatment reviewed.  Manual pump provided and #24 flange size is appropriate.  Demonstrated pumping and suggested mother use the manual pump if she is not interested in using the DEBP.  She can feed back any EBM she obtains to baby.  Mother has a DEBP for home use.  She is hoping to be discharged today.  Father present.  Mother will call for any  questions/concerns.   Maternal Data    Feeding Feeding Type: Breast Fed  LATCH Score                   Interventions    Lactation Tools Discussed/Used     Consult Status Consult Status: Complete Date: 02/27/20 Follow-up type: Call as needed    Cristianna Cyr R Briseida Gittings 02/27/2020, 8:26 AM

## 2020-03-28 ENCOUNTER — Other Ambulatory Visit: Payer: Self-pay

## 2020-03-28 ENCOUNTER — Encounter (INDEPENDENT_AMBULATORY_CARE_PROVIDER_SITE_OTHER): Payer: Medicaid Other | Admitting: Obstetrics and Gynecology

## 2020-03-28 NOTE — Progress Notes (Signed)
Called pt @ 1455 and was unable to leave message due to voicemail box not set up.

## 2020-03-31 NOTE — Progress Notes (Signed)
Pt did not keep her appt today.  Duane Lope, NP 03/31/2020 12:10 PM

## 2020-04-02 IMAGING — US US MFM OB DETAIL+14 WK
1 series · 13 of 28 positions shown · non-contrast
Comparison: none

[Series 1: us mfm ob detail+14 wk · 13 of 92 slices shown]
[im 4/92]
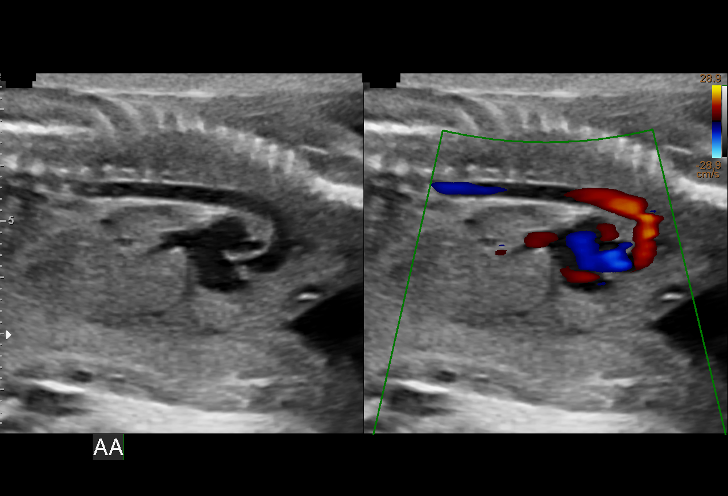
[im 11/92]
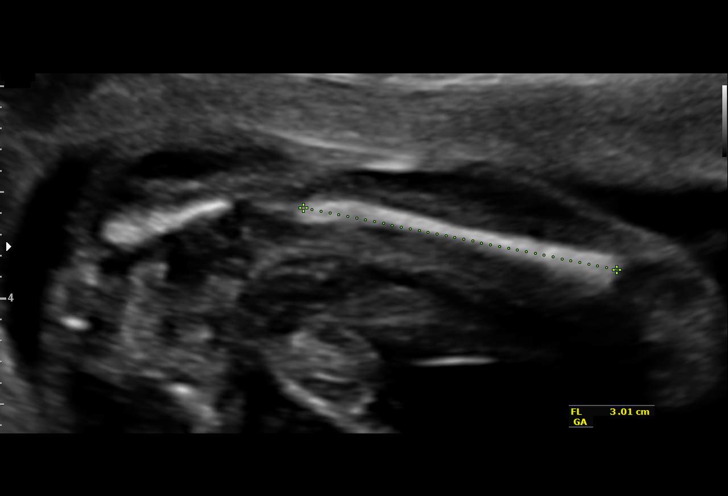
[im 17/92]
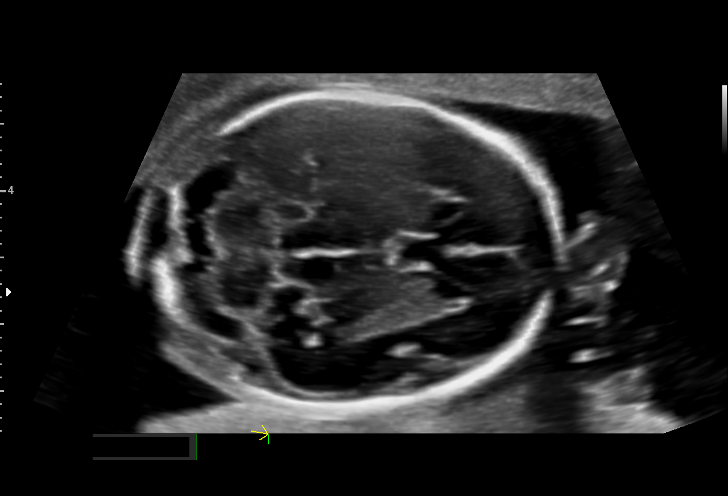
[im 24/92]
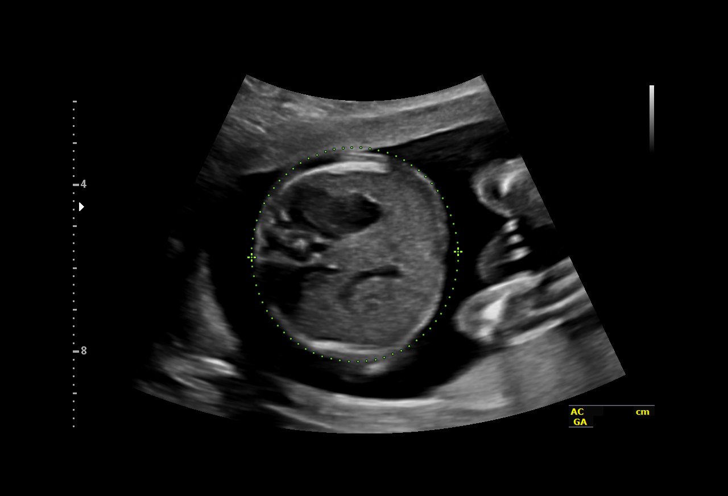
[im 31/92]
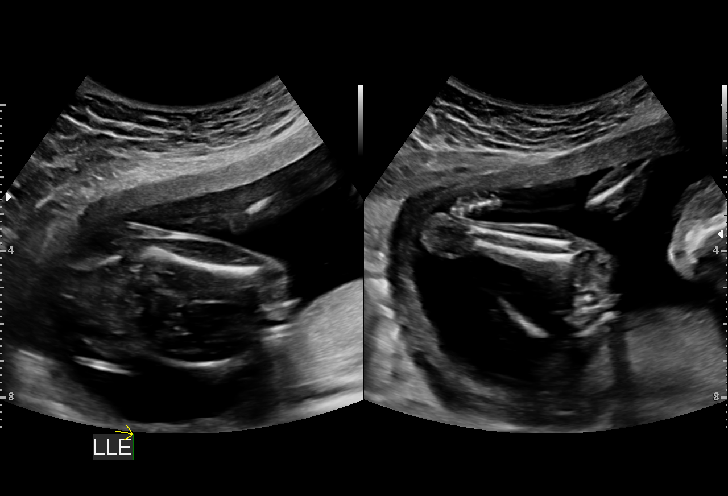
[im 38/92]
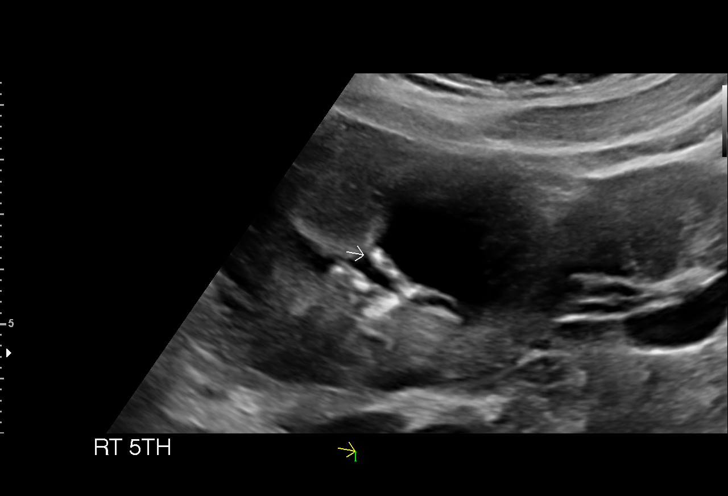
[im 48/92]
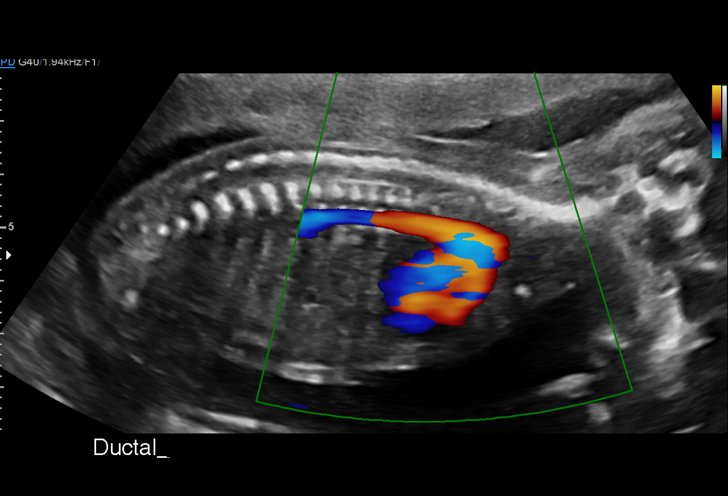
[im 54/92]
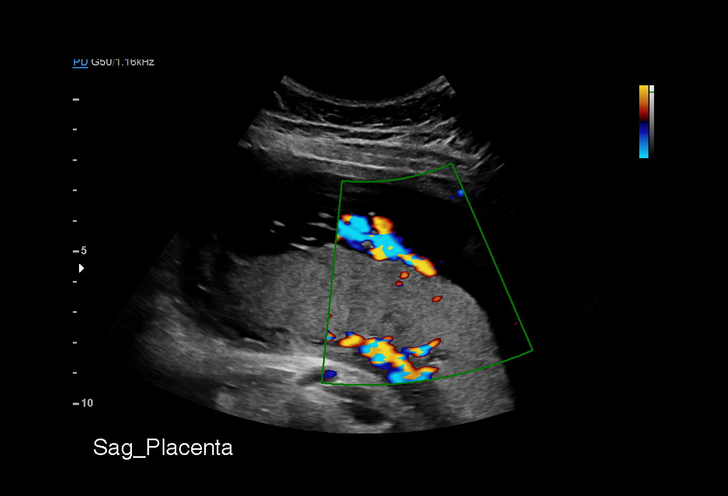
[im 61/92]
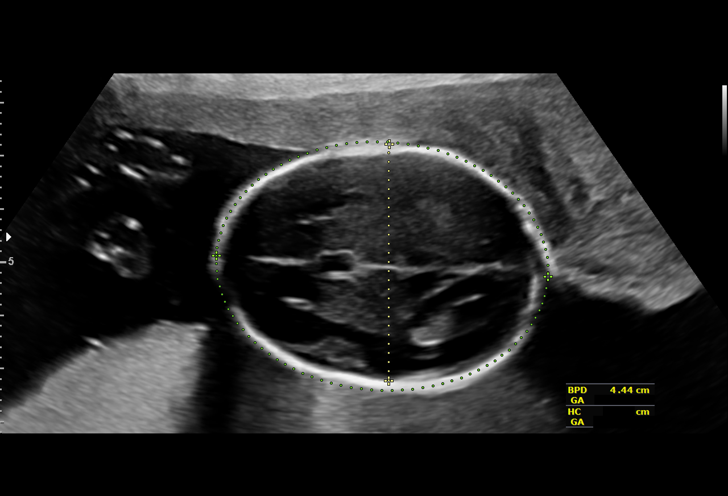
[im 68/92]
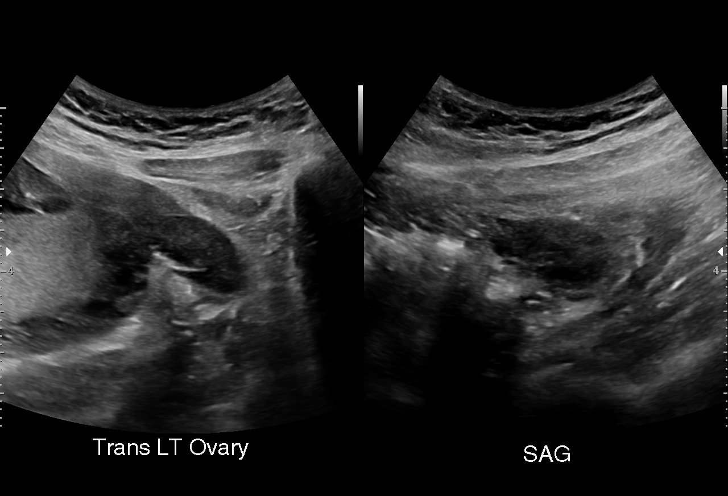
[im 75/92]
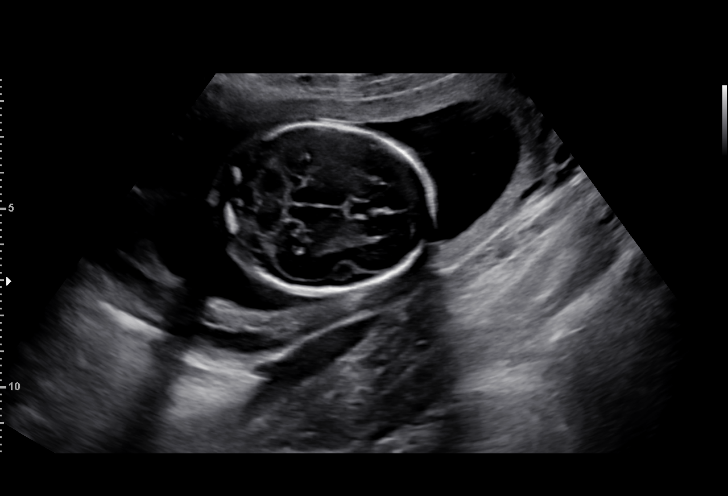
[im 81/92]
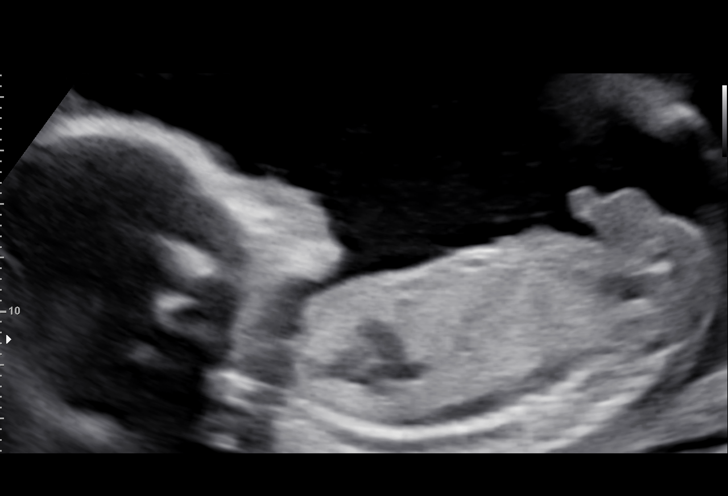
[im 88/92]
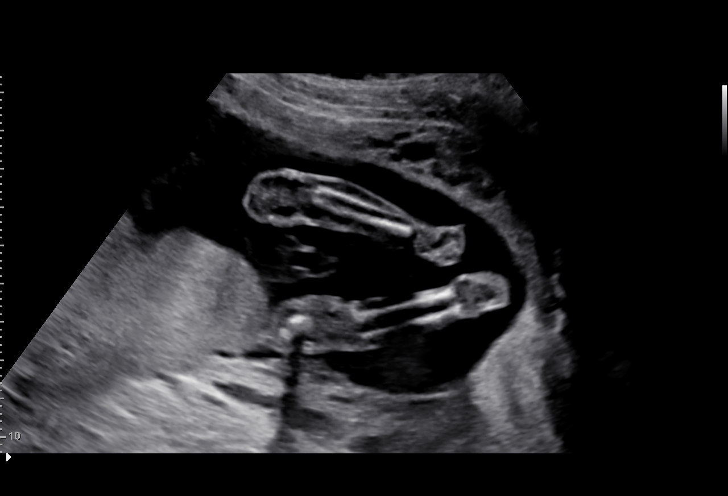

[13 of 28 positions shown; findings below may reference images not displayed]

[REDACTED]. [HOSPITAL],
                   [facility] CNM

  1  US MFM OB DETAIL +14 WK              76811.01     RHODORA LIBIN
 ----------------------------------------------------------------------

 ----------------------------------------------------------------------
Indications

  Fetal abnormality - other known or suspected
  Encounter for antenatal screening for
  malformations
  Genetic carrier (SMA carrier)
  19 weeks gestation of pregnancy
  Fetal abnormality known or suspected
 ----------------------------------------------------------------------
Vital Signs

 BMI:
Fetal Evaluation

 Num Of Fetuses:         1
 Fetal Heart Rate(bpm):  145
 Cardiac Activity:       Observed
 Presentation:           Variable
 Placenta:               Posterior
 P. Cord Insertion:      Visualized, central

 Amniotic Fluid
 AFI FV:      Within normal limits

                             Largest Pocket(cm)

Biometry

 BPD:      44.9  mm     G. Age:  19w 4d         57  %    CI:        68.17   %    70 - 86
                                                         FL/HC:      17.4   %    16.1 -
 HC:      173.9  mm     G. Age:  19w 6d         67  %    HC/AC:      1.11        1.09 -
 AC:      156.1  mm     G. Age:  20w 5d         85  %    FL/BPD:     67.5   %
 FL:       30.3  mm     G. Age:  19w 3d         40  %    FL/AC:      19.4   %    20 - 24
 CER:      21.2  mm     G. Age:  20w 1d         64  %
 NFT:         5  mm

 LV:        5.5  mm
 CM:          3  mm

 Est. FW:     331  gm    0 lb 12 oz      82  %
OB History

 Gravidity:    4         Term:   3
 Living:       3
Gestational Age

 U/S Today:     19w 6d                                        EDD:   03/19/20
 Best:          19w 3d     Det. By:  Early Ultrasound         EDD:   03/22/20
                                     (07/28/19)
Anatomy

 Cranium:               Appears normal         Aortic Arch:            Appears normal
 Cavum:                 Appears normal         Ductal Arch:            Appears normal
 Ventricles:            Appears normal         Diaphragm:              Appears normal
 Choroid Plexus:        Appears normal         Stomach:                Appears normal, left
                                                                       sided
 Cerebellum:            Appears normal         Abdomen:                Appears normal
 Posterior Fossa:       Appears normal         Abdominal Wall:         Appears nml (cord
                                                                       insert, abd wall)
 Nuchal Fold:           Appears normal         Cord Vessels:           Appears normal (3
                                                                       vessel cord)
 Face:                  Appears normal         Kidneys:                Appear normal
                        (orbits and profile)
 Lips:                  Appears normal         Bladder:                Appears normal
 Thoracic:              Appears normal         Spine:                  Appears normal
 Heart:                 Not well visualized    Upper Extremities:      Appears normal
 RVOT:                  Not well visualized    Lower Extremities:      Not well visualized
 LVOT:                  Not well visualized

 Other:  Male gender. 5th digit visualized. Technically difficult due to fetal
         position.
Cervix Uterus Adnexa

 Cervix
 Length:            3.6  cm.
 Normal appearance by transabdominal scan.

 Uterus
 No abnormality visualized.

 Left Ovary
 Within normal limits.

 Right Ovary
 Within normal limits.

 Cul De Sac
 No free fluid seen.
 Adnexa
 No abnormality visualized.
Comments

 This patient was seen for a detailed fetal anatomy scan. She
 denies any significant past medical history and denies any
 problems in her current pregnancy.
 She had a cell free DNA test earlier in her pregnancy which
 indicated a low risk for trisomy 21, 18, and 13. A male fetus is
 predicted.
 She was informed that the fetal growth and amniotic fluid
 level were appropriate for her gestational age.
 The views of the fetal anatomy were limited today due to the
 fetal position.
 The patient was informed that anomalies may be missed due
 to technical limitations. If the fetus is in a suboptimal position
 or maternal habitus is increased, visualization of the fetus in
 the maternal uterus may be impaired.
 A follow-up exam was scheduled in 4 weeks to complete the
 views of the fetal anatomy.

## 2020-04-30 IMAGING — US US MFM OB FOLLOW-UP
1 series · 13 of 28 positions shown · non-contrast
Comparison: none

[Series 1: us mfm ob follow-up · 13 of 108 slices shown]
[im 4/108]
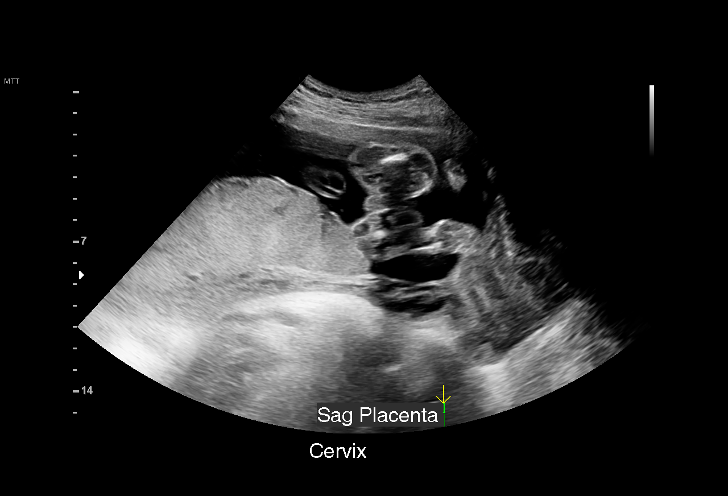
[im 12/108]
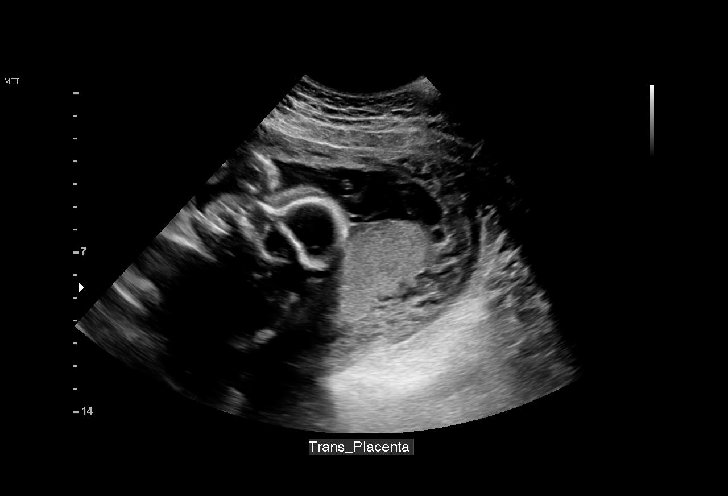
[im 20/108]
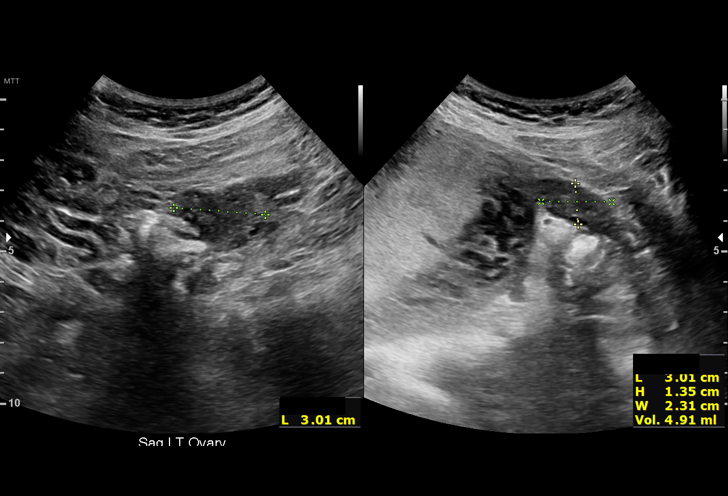
[im 28/108]
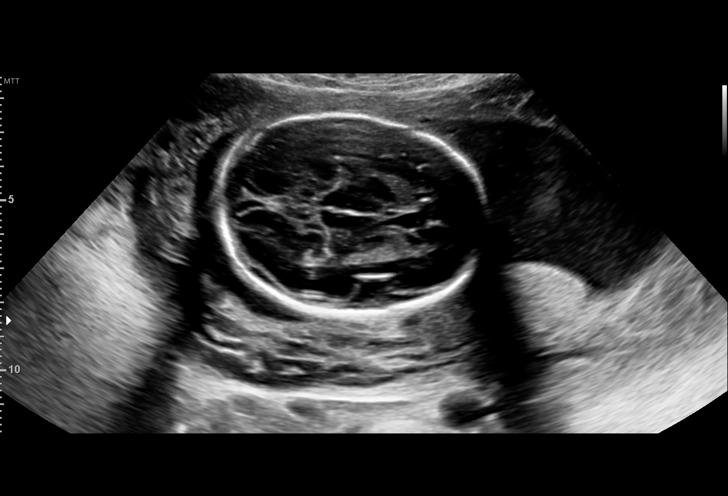
[im 36/108]
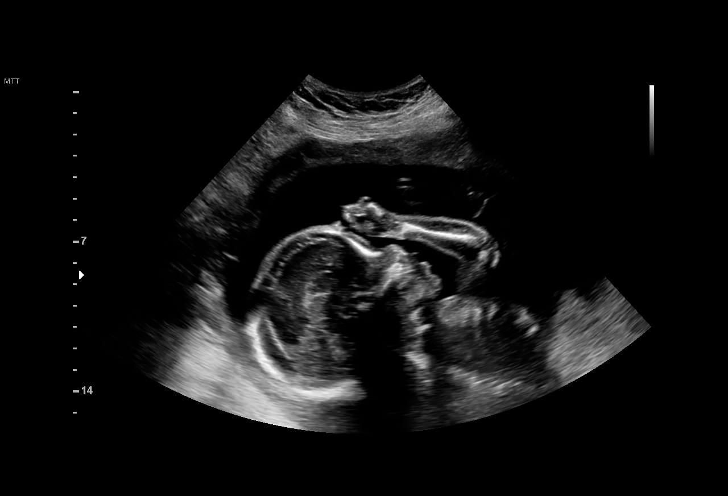
[im 44/108]
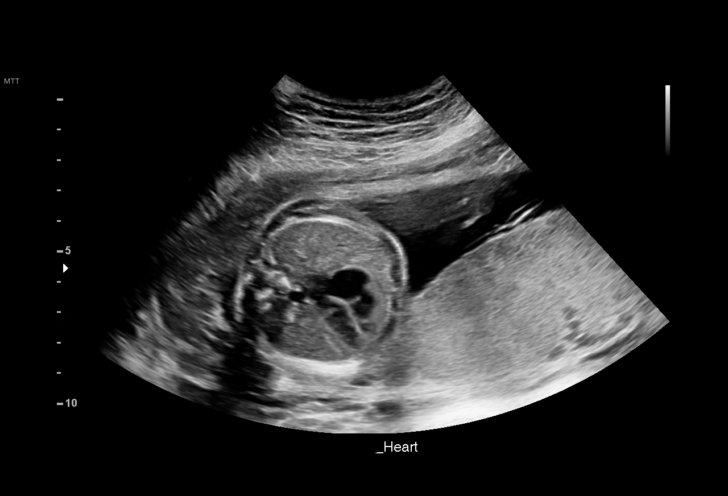
[im 56/108]
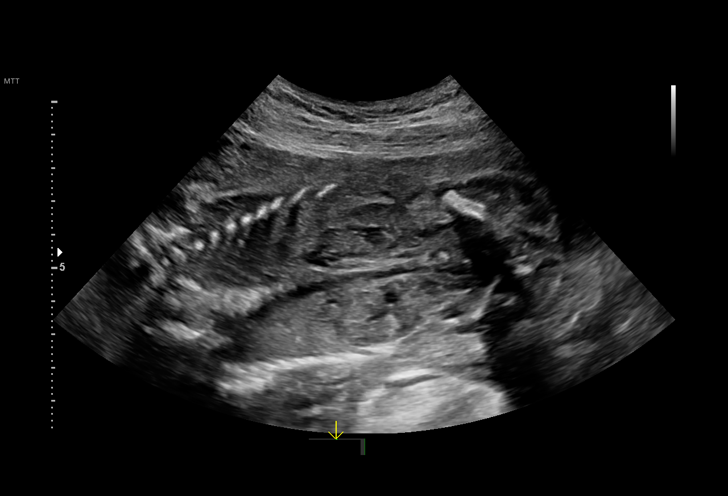
[im 64/108]
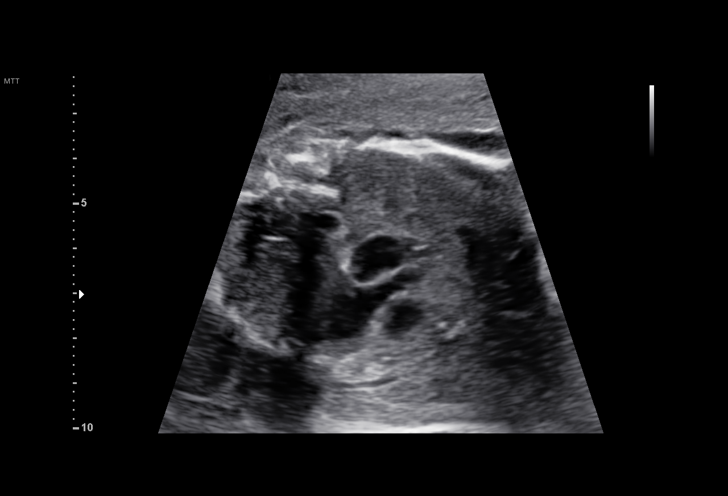
[im 72/108]
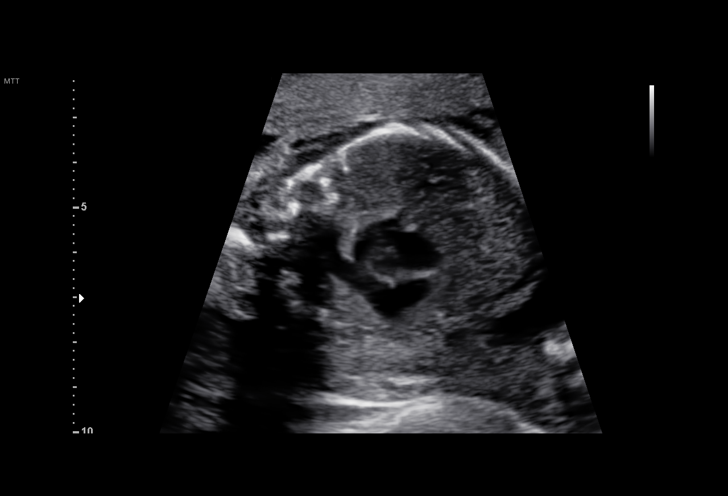
[im 80/108]
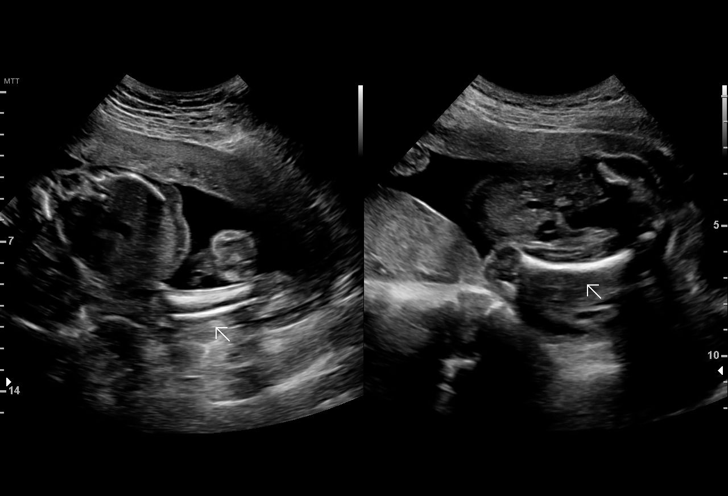
[im 88/108]
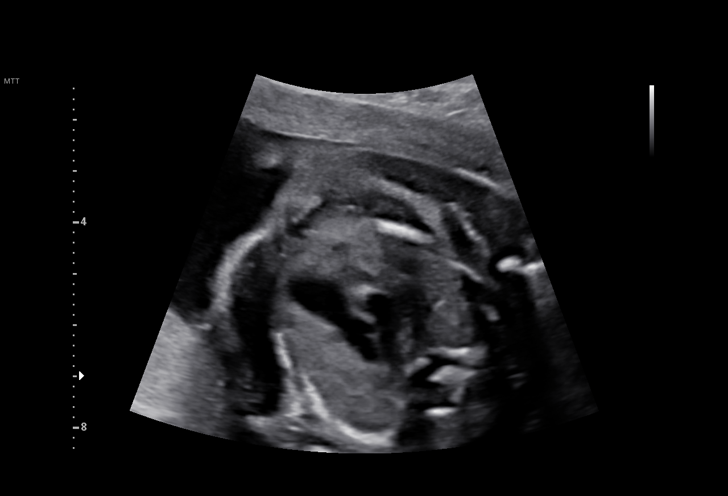
[im 96/108]
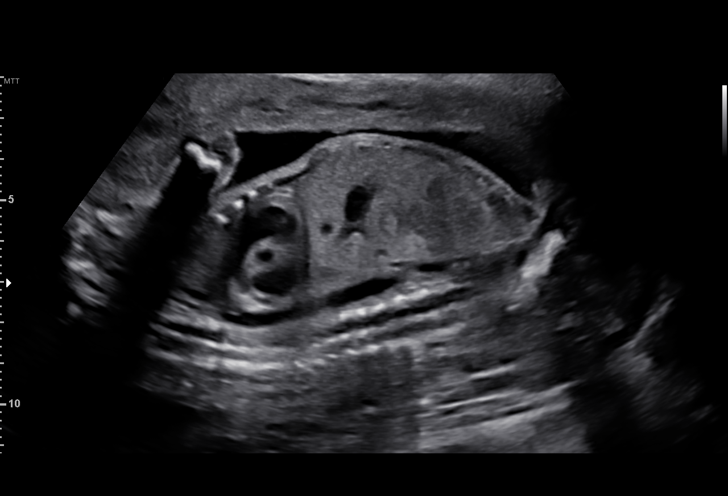
[im 104/108]
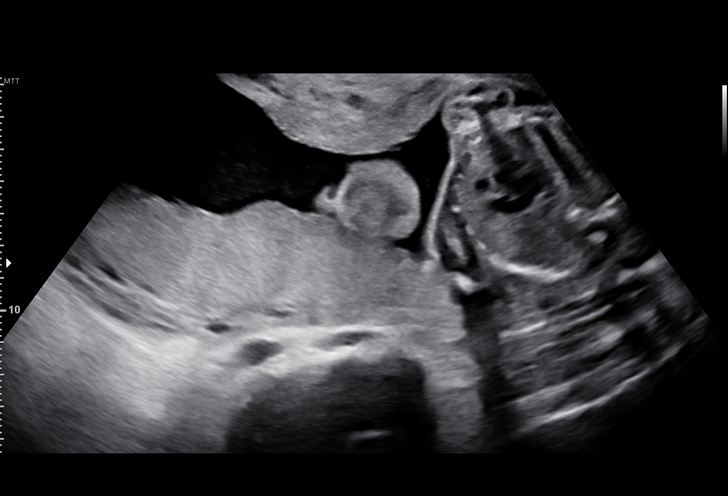

[13 of 28 positions shown; findings below may reference images not displayed]

[REDACTED]. [HOSPITAL],
                   [facility]IC[facility] CNM

 ----------------------------------------------------------------------

 ----------------------------------------------------------------------
Indications

  Antenatal follow-up for nonvisualized fetal
  anatomy
  23 weeks gestation of pregnancy
  Genetic carrier (SMA carrier)
  Poor obstetric history: Previous fetal growth
  restriction (FGR)
 ----------------------------------------------------------------------
Vital Signs

                                                Height:        5'2"
Fetal Evaluation

 Num Of Fetuses:         1
 Fetal Heart Rate(bpm):  140
 Cardiac Activity:       Observed
 Presentation:           Breech
 Placenta:               Posterior
 P. Cord Insertion:      Previously Visualized

 Amniotic Fluid
 AFI FV:      Within normal limits

                             Largest Pocket(cm)

Biometry

 BPD:      58.6  mm     G. Age:  24w 0d         65  %    CI:        71.93   %    70 - 86
                                                         FL/HC:      18.9   %    19.2 -
 HC:      219.9  mm     G. Age:  24w 0d         58  %    HC/AC:      1.13        1.05 -
 AC:      194.5  mm     G. Age:  24w 1d         65  %    FL/BPD:     70.8   %    71 - 87
 FL:       41.5  mm     G. Age:  23w 3d         40  %    FL/AC:      21.3   %    20 - 24
 CER:      27.9  mm     G. Age:  25w 0d         82  %
 LV:        5.7  mm
 CM:        3.2  mm

 Est. FW:     638  gm      1 lb 7 oz     64  %
OB History

 Gravidity:    4         Term:   3
 Living:       3
Gestational Age

 U/S Today:     23w 6d                                        EDD:   03/19/20
 Best:          23w 3d     Det. By:  Early Ultrasound         EDD:   03/22/20
                                     (07/28/19)
Anatomy

 Cranium:               Appears normal         Aortic Arch:            Appears normal
 Cavum:                 Appears normal         Ductal Arch:            Appears normal
 Ventricles:            Appears normal         Diaphragm:              Appears normal
 Choroid Plexus:        Appears normal         Stomach:                Appears normal, left
                                                                       sided
 Cerebellum:            Appears normal         Abdomen:                Previously seen
 Posterior Fossa:       Appears normal         Abdominal Wall:         Previously seen
 Nuchal Fold:           Previously seen        Cord Vessels:           Previously seen
 Face:                  Orbits and profile     Kidneys:                Appear normal
                        previously seen
 Lips:                  Appears normal         Bladder:                Appears normal
 Thoracic:              Appears normal         Spine:                  Previously seen
 Heart:                 Appears normal         Upper Extremities:      Previously seen
                        (4CH, axis, and
                        situs)
 RVOT:                  Appears normal         Lower Extremities:      Appears normal
 LVOT:                  Appears normal

 Other:  Male gender. 5th digit visualized. Heels appear normal. Technically
         difficult due to fetal position.
Cervix Uterus Adnexa

 Cervix
 Length:           3.99  cm.
 Normal appearance by transabdominal scan.

 Uterus
 No abnormality visualized.

 Left Ovary
 Within normal limits. No adnexal mass visualized.

 Right Ovary
 Within normal limits. No adnexal mass visualized.

 Cul De Sac
 No free fluid seen.

 Adnexa
 No abnormality visualized.
Comments

 This patient was seen for a follow up exam as the fetal
 cardiac views were unable to be fully visualized during her
 prior exam.  She denies any problems since her last exam.
 She was informed that the fetal growth and amniotic fluid
 level appears appropriate for her gestational age.
 The views of the fetal heart were visualized today.  There
 were no obvious anomalies suspected.  The limitations of
 ultrasound in the detection of all anomalies was discussed.
 Follow-up as indicated.

## 2020-05-02 ENCOUNTER — Encounter: Payer: Self-pay | Admitting: Obstetrics and Gynecology

## 2020-05-02 ENCOUNTER — Ambulatory Visit: Payer: Medicaid Other | Admitting: Obstetrics and Gynecology

## 2020-12-13 ENCOUNTER — Ambulatory Visit: Payer: Medicaid Other

## 2021-07-16 ENCOUNTER — Other Ambulatory Visit: Payer: Self-pay

## 2021-07-16 ENCOUNTER — Ambulatory Visit
Admission: EM | Admit: 2021-07-16 | Discharge: 2021-07-16 | Disposition: A | Discharge status: Home / Self Care | Payer: Medicaid Other | Attending: Internal Medicine | Admitting: Internal Medicine

## 2021-07-16 ENCOUNTER — Encounter: Payer: Self-pay | Admitting: Emergency Medicine

## 2021-07-16 DIAGNOSIS — Z20822 Contact with and (suspected) exposure to covid-19: Secondary | ICD-10-CM | POA: Diagnosis present

## 2021-07-16 DIAGNOSIS — Z20828 Contact with and (suspected) exposure to other viral communicable diseases: Secondary | ICD-10-CM | POA: Diagnosis present

## 2021-07-16 DIAGNOSIS — J069 Acute upper respiratory infection, unspecified: Secondary | ICD-10-CM | POA: Diagnosis not present

## 2021-07-16 DIAGNOSIS — J029 Acute pharyngitis, unspecified: Secondary | ICD-10-CM | POA: Diagnosis not present

## 2021-07-16 LAB — POCT RAPID STREP A (OFFICE): Rapid Strep A Screen: NEGATIVE

## 2021-07-16 NOTE — Discharge Instructions (Addendum)
It is suspected that you have RSV given your close exposure.  This is here with symptomatic treatment.  You likely having a viral upper respiratory infection. We recommended symptom control. I expect your symptoms to start improving in the next 1-2 weeks.   1. Take a daily allergy pill/anti-histamine like Zyrtec, Claritin, or Store brand consistently for 2 weeks  2. For congestion you may try an oral decongestant like Mucinex or sudafed. You may also try intranasal flonase nasal spray or saline irrigations (neti pot, sinus cleanse)  3. For your sore throat you may try cepacol lozenges, salt water gargles, throat spray. Treatment of congestion may also help your sore throat.  4. For cough you may try Robitussen, Mucinex DM  5. Take Tylenol or Ibuprofen to help with pain/inflammation  6. Stay hydrated, drink plenty of fluids to keep throat coated and less irritated  Honey Tea For cough/sore throat try using a honey-based tea. Use 3 teaspoons of honey with juice squeezed from half lemon. Place shaved pieces of ginger into 1/2-1 cup of water and warm over stove top. Then mix the ingredients and repeat every 4 hours as needed.   Your rapid strep test was negative.  Throat culture and COVID-19 viral swab are pending.  We will call if they are positive.

## 2021-07-16 NOTE — ED Triage Notes (Signed)
Patient c/o sore throat and nasal congestion x 1 day.  Patient is taken Xyzal and Benadryl.  Patient is vaccinated for COVID.

## 2021-07-16 NOTE — ED Provider Notes (Signed)
[facility]    CSN: [id number] Arrival date & time: 07/16/21  1843      History   Chief Complaint Chief Complaint  Patient presents with   Nasal Congestion    HPI Beverly Dickson is a 30 y.o. female.   Patient presents with 1 day history of sore throat and nasal congestion.  Denies any coughs, fever, ear pain, nausea, vomiting, diarrhea.  Child tested positive for RSV a few days prior.  Has been taking Xyzal and Benadryl with minimal improvement in symptoms.  Denies chest pain or shortness of breath.    Past Medical History:  Diagnosis Date   Medical history non-contributory    UTI (urinary tract infection)     Patient Active Problem List   Diagnosis Date Noted   Encounter for initial prescription of implantable subdermal contraceptive    Preterm labor 02/25/2020   Genetic carrier 11/28/2019   Supervision of low-risk pregnancy 09/04/2019   History of prior pregnancy with SGA newborn 09/04/2019    Past Surgical History:  Procedure Laterality Date   NO PAST SURGERIES      OB History     Gravida  4   Para  4   Term  3   Preterm  1   AB  0   Living  4      SAB  0   IAB  0   Ectopic  0   Multiple  0   Live Births  4            Home Medications    Prior to Admission medications   Medication Sig Start Date End Date Taking? Authorizing Provider  clobetasol ointment (TEMOVATE) 0.05 % Apply 1 application topically 2 (two) times daily. 12/13/19  Yes Anyanwu, Jethro Bastos, MD  diphenhydrAMINE (BENADRYL) 25 mg capsule Take 25 mg by mouth every 6 (six) hours as needed for allergies.   Yes [provider]  famotidine (PEPCID) 20 MG tablet Take 1 tablet (20 mg total) by mouth 2 (two) times daily. 11/06/19  Yes De Kalb Bing, MD  fluticasone (FLONASE) 50 MCG/ACT nasal spray Place 1 spray into both nostrils daily. 05/17/19  Yes Harris, Abigail, PA-C  ibuprofen (ADVIL) 600 MG tablet Take 1 tablet (600 mg total) by mouth every 6 (six)  hours. 02/27/20  Yes Mirian Mo, MD  levocetirizine (XYZAL) 5 MG tablet Take 5 mg by mouth every evening.   Yes [provider]  pantoprazole (PROTONIX) 40 MG tablet Take 1 tablet (40 mg total) by mouth daily. 12/13/19  Yes Anyanwu, Jethro Bastos, MD  Prenatal Vit-Fe Fumarate-FA (PREPLUS) 27-1 MG TABS Take 1 tablet by mouth daily. 10/04/19  Yes Judeth Horn, NP    Family History Family History  Adopted: Yes  Family history unknown: Yes    Social History Social History   Tobacco Use   Smoking status: Never   Smokeless tobacco: Never  Vaping Use   Vaping Use: Never used  Substance Use Topics   Alcohol use: Never   Drug use: Not Currently    Types: Marijuana    Comment: end of October     Allergies   Other   Review of Systems Review of Systems Per HPI  Physical Exam Triage Vital Signs ED Triage Vitals  Enc Vitals Group     BP 07/16/21 1923 118/66     Pulse Rate 07/16/21 1923 96     Resp 07/16/21 1923 18     Temp 07/16/21 1923 97.9 F (36.6  C)     Temp Source 07/16/21 1923 Oral     SpO2 07/16/21 1923 98 %     Weight 07/16/21 1924 136 lb (61.7 kg)     Height 07/16/21 1924 5\' 1"  (1.549 m)     Head Circumference --      Peak Flow --      Pain Score 07/16/21 1924 7     Pain Loc --      Pain Edu? --      Excl. in GC? --    No data found.  Updated Vital Signs BP 118/66 (BP Location: Right Arm)   Pulse 96   Temp 97.9 F (36.6 C) (Oral)   Resp 18   Ht 5\' 1"  (1.549 m)   Wt 136 lb (61.7 kg)   SpO2 98%   Breastfeeding Yes   BMI 25.70 kg/m   Visual Acuity Right Eye Distance:   Left Eye Distance:   Bilateral Distance:    Right Eye Near:   Left Eye Near:    Bilateral Near:     Physical Exam Constitutional:      General: She is not in acute distress.    Appearance: Normal appearance. She is not toxic-appearing or diaphoretic.  HENT:     Head: Normocephalic and atraumatic.     Right Ear: Tympanic membrane and ear canal normal.     Left Ear:  Tympanic membrane and ear canal normal.     Nose: Congestion present.     Mouth/Throat:     Mouth: Mucous membranes are moist.     Pharynx: Posterior oropharyngeal erythema present.  Eyes:     Extraocular Movements: Extraocular movements intact.     Conjunctiva/sclera: Conjunctivae normal.     Pupils: Pupils are equal, round, and reactive to light.  Cardiovascular:     Rate and Rhythm: Normal rate and regular rhythm.     Pulses: Normal pulses.     Heart sounds: Normal heart sounds.  Pulmonary:     Effort: Pulmonary effort is normal. No respiratory distress.     Breath sounds: Normal breath sounds. No wheezing.  Abdominal:     General: Abdomen is flat. Bowel sounds are normal.     Palpations: Abdomen is soft.  Musculoskeletal:        General: Normal range of motion.     Cervical back: Normal range of motion.  Skin:    General: Skin is warm and dry.  Neurological:     General: No focal deficit present.     Mental Status: She is alert and oriented to person, place, and time. Mental status is at baseline.  Psychiatric:        Mood and Affect: Mood normal.        Behavior: Behavior normal.     UC Treatments / Results  Labs (all labs ordered are listed, but only abnormal results are displayed) Labs Reviewed  CULTURE, GROUP A STREP ([facility])  COVID-19, FLU A+B AND RSV  POCT RAPID STREP A (OFFICE)    EKG   Radiology No results found.  Procedures Procedures (including critical care time)  Medications Ordered in UC Medications - No data to display  Initial Impression / Assessment and Plan / UC Course  I have reviewed the triage vital signs and the nursing notes.  Pertinent labs & imaging results that were available during my care of the patient were reviewed by me and considered in my medical decision making (see chart for details).  Patient presents with symptoms likely from a viral upper respiratory infection. Differential includes bacterial pneumonia, sinusitis,  allergic rhinitis, Covid 19, flu. Do not suspect underlying cardiopulmonary process. Symptoms seem unlikely related to ACS, CHF or COPD exacerbations, pneumonia, pneumothorax. Patient is nontoxic appearing and not in need of emergent medical intervention.  Highly suspicious for RSV given patient's close exposure.  Rapid strep test is negative.  Throat culture and COVID-19, flu, RSV swab pending.  Recommended symptom control with over the counter medications: Daily oral anti-histamine, Oral decongestant or IN corticosteroid, saline irrigations, cepacol lozenges, Robitussin, Delsym, honey tea.  Return if symptoms fail to improve in 1-2 weeks or you develop shortness of breath, chest pain, severe headache. Patient states understanding and is agreeable.  Discharged with PCP followup.  Final Clinical Impressions(s) / UC Diagnoses   Final diagnoses:  Viral upper respiratory infection  Sore throat  Encounter for laboratory testing for COVID-19 virus  Exposure to respiratory syncytial virus (RSV)     Discharge Instructions      It is suspected that you have RSV given your close exposure.  This is here with symptomatic treatment.  You likely having a viral upper respiratory infection. We recommended symptom control. I expect your symptoms to start improving in the next 1-2 weeks.   1. Take a daily allergy pill/anti-histamine like Zyrtec, Claritin, or Store brand consistently for 2 weeks  2. For congestion you may try an oral decongestant like Mucinex or sudafed. You may also try intranasal flonase nasal spray or saline irrigations (neti pot, sinus cleanse)  3. For your sore throat you may try cepacol lozenges, salt water gargles, throat spray. Treatment of congestion may also help your sore throat.  4. For cough you may try Robitussen, Mucinex DM  5. Take Tylenol or Ibuprofen to help with pain/inflammation  6. Stay hydrated, drink plenty of fluids to keep throat coated and less  irritated  Honey Tea For cough/sore throat try using a honey-based tea. Use 3 teaspoons of honey with juice squeezed from half lemon. Place shaved pieces of ginger into 1/2-1 cup of water and warm over stove top. Then mix the ingredients and repeat every 4 hours as needed.   Your rapid strep test was negative.  Throat culture and COVID-19 viral swab are pending.  We will call if they are positive.     ED Prescriptions   None    PDMP not reviewed this encounter.   Lance Muss, FNP 07/16/21 2010

## 2021-07-19 LAB — COVID-19, FLU A+B AND RSV
Influenza A, NAA: NOT DETECTED
Influenza B, NAA: NOT DETECTED
RSV, NAA: DETECTED — AB
SARS-CoV-2, NAA: DETECTED — AB

## 2021-07-20 LAB — CULTURE, GROUP A STREP (THRC)

## 2021-12-12 ENCOUNTER — Encounter (INDEPENDENT_AMBULATORY_CARE_PROVIDER_SITE_OTHER): Payer: Self-pay

## 2021-12-15 ENCOUNTER — Other Ambulatory Visit: Payer: Self-pay

## 2021-12-15 ENCOUNTER — Encounter: Payer: Self-pay | Admitting: Advanced Practice Midwife

## 2021-12-15 ENCOUNTER — Other Ambulatory Visit (HOSPITAL_COMMUNITY)
Admission: RE | Admit: 2021-12-15 | Discharge: 2021-12-15 | Disposition: A | Discharge status: Home / Self Care | Payer: Medicaid Other | Source: Ambulatory Visit | Attending: Advanced Practice Midwife | Admitting: Advanced Practice Midwife

## 2021-12-15 ENCOUNTER — Ambulatory Visit (INDEPENDENT_AMBULATORY_CARE_PROVIDER_SITE_OTHER): Payer: Medicaid Other | Admitting: Advanced Practice Midwife

## 2021-12-15 VITALS — Ht 61.0 in | Wt 138.4 lb

## 2021-12-15 DIAGNOSIS — Z01419 Encounter for gynecological examination (general) (routine) without abnormal findings: Secondary | ICD-10-CM | POA: Diagnosis not present

## 2021-12-15 DIAGNOSIS — Z0000 Encounter for general adult medical examination without abnormal findings: Secondary | ICD-10-CM | POA: Diagnosis not present

## 2021-12-15 DIAGNOSIS — Z124 Encounter for screening for malignant neoplasm of cervix: Secondary | ICD-10-CM | POA: Insufficient documentation

## 2021-12-15 DIAGNOSIS — F419 Anxiety disorder, unspecified: Secondary | ICD-10-CM

## 2021-12-15 NOTE — Progress Notes (Unsigned)
° °  Subjective:     Beverly Dickson is a 31 y.o. female here at [facility] *** for a routine exam.  Current complaints: ***.  Personal health questionnaire reviewed: {yes/no:9010}.  Do you have a primary care provider? *** Do you feel safe at home? ***  Flowsheet Row Office Visit from 12/15/2021 in [place] for [place] [facility]  PHQ-2 Total Score 0       Health Maintenance Due  Topic Date Due   Hepatitis C Screening  Never done   PAP SMEAR-Modifier  Never done   INFLUENZA VACCINE  Never done     Risk factors for chronic health problems: Smoking: Alchohol/how much: Pt BMI: Body mass index is 26.15 kg/m.   Gynecologic History Patient's last menstrual period was 10/05/2021 (approximate). Contraception: {method:5051} Last Pap: ***. Results were: {norm/abn:16337} Last mammogram: ***. Results were: {norm/abn:16337}  Obstetric History OB History  Gravida Para Term Preterm AB Living  $Remov'4 4 3 1 'rXFJYB$ 0 4  SAB IAB Ectopic Multiple Live Births  0 0 0 0 4    # Outcome Date GA Lbr Len/2nd Weight Sex Delivery Anes PTL Lv  4 Preterm 02/25/20 [redacted]w[redacted]d 10:35 / 00:07 5 lb 10.7 oz (2.571 kg) M Vag-Spont EPI  LIV     Birth Comments: wnl  3 Term 07/20/15 [redacted]w[redacted]d  4 lb 9 oz (2.07 kg) F Vag-Spont   LIV     Birth Comments: had kidney infection; bladder infection; blood infection[facility] 2 [facility]  2 Term 06/17/14 [redacted]w[redacted]d  6 lb 7 oz (2.92 kg) F Vag-Spont None  LIV     Birth Comments: no complications  1 Term 33/35/45 [redacted]w[redacted]d  6 lb 9 oz (2.977 kg) F Vag-Spont None  LIV     Birth Comments: no complications     {Common ambulatory SmartLinks:19316}  Review of Systems {ros; complete:30496}    Objective:   Ht $R'5\' 1"'Dq$  (1.549 m)    Wt 138 lb 6.4 oz (62.8 kg)    LMP 10/05/2021 (Approximate)    BMI 26.15 kg/m  VS reviewed, nursing note reviewed,  Constitutional: well developed, well nourished, no distress HEENT: normocephalic CV: normal  rate Pulm/chest wall: normal effort Breast Exam:  ***Deferred with low risks and shared decision making, discussed recommendation to start mammogram between 40-50 yo/ exam performed: right breast normal without mass, skin or nipple changes or axillary nodes, left breast normal without mass, skin or nipple changes or axillary nodes Abdomen: soft Neuro: alert and oriented x 3 Skin: warm, dry Psych: affect normal Pelvic exam: ***Deferred/ Performed: Cervix pink, visually closed, without lesion, scant white creamy discharge, vaginal walls and external genitalia normal Bimanual exam: Cervix 0/long/high, firm, anterior, neg CMT, uterus nontender, nonenlarged, adnexa without tenderness, enlargement, or mass       Assessment/Plan:   1. Healthcare maintenance *** - Ambulatory referral to [facility]  2. Cervical cancer screening *** - Cytology - PAP( [place])  3. Well woman exam with routine gynecological exam ***  4. Mild anxiety *** - Comp Met (CMET) - TSH - Vitamin D (25 hydroxy) - CBC       Follow up in: {1-10:13787:::0} {time; units:19136:::0} or as needed.   Fatima Blank, CNM 4:56 PM

## 2021-12-16 LAB — COMPREHENSIVE METABOLIC PANEL
ALT: 13 IU/L (ref 0–32)
AST: 13 IU/L (ref 0–40)
Albumin/Globulin Ratio: 1.8 (ref 1.2–2.2)
Albumin: 4.8 g/dL (ref 3.8–4.8)
Alkaline Phosphatase: 93 IU/L (ref 44–121)
BUN/Creatinine Ratio: 15 (ref 9–23)
BUN: 12 mg/dL (ref 6–20)
Bilirubin Total: 0.5 mg/dL (ref 0.0–1.2)
CO2: 22 mmol/L (ref 20–29)
Calcium: 10.2 mg/dL (ref 8.7–10.2)
Chloride: 104 mmol/L (ref 96–106)
Creatinine, Ser: 0.8 mg/dL (ref 0.57–1.00)
Globulin, Total: 2.6 g/dL (ref 1.5–4.5)
Glucose: 81 mg/dL (ref 70–99)
Potassium: 4.3 mmol/L (ref 3.5–5.2)
Sodium: 141 mmol/L (ref 134–144)
Total Protein: 7.4 g/dL (ref 6.0–8.5)
eGFR: 101 mL/min/{1.73_m2} (ref >59)

## 2021-12-16 LAB — CBC
Hematocrit: 42.6 % (ref 34.0–46.6)
Hemoglobin: 14.5 g/dL (ref 11.1–15.9)
MCH: 29.5 pg (ref 26.6–33.0)
MCHC: 34 g/dL (ref 31.5–35.7)
MCV: 87 fL (ref 79–97)
Platelets: 362 10*3/uL (ref 150–450)
RBC: 4.92 x10E6/uL (ref 3.77–5.28)
RDW: 11.9 % (ref 11.7–15.4)
WBC: 4.6 10*3/uL (ref 3.4–10.8)

## 2021-12-16 LAB — VITAMIN D 25 HYDROXY (VIT D DEFICIENCY, FRACTURES): Vit D, 25-Hydroxy: 22.3 ng/mL — ABNORMAL LOW (ref 30.0–100.0)

## 2021-12-16 LAB — TSH: TSH: 0.521 u[IU]/mL (ref 0.450–4.500)

## 2021-12-17 LAB — CYTOLOGY - PAP
Chlamydia: NEGATIVE
Comment: NEGATIVE
Comment: NEGATIVE
Comment: NEGATIVE
Comment: NORMAL
Diagnosis: NEGATIVE
Diagnosis: REACTIVE
High risk HPV: NEGATIVE
Neisseria Gonorrhea: NEGATIVE
Trichomonas: NEGATIVE
